# Patient Record
Sex: Female | Born: 1965 | Race: White | Hispanic: No | Marital: Married | Smoking: Current every day smoker
Health system: Southern US, Community
[De-identification: ages and names within clinical notes are randomized; demographics above are authoritative.]

## PROBLEM LIST (undated history)

## (undated) ENCOUNTER — Emergency Department: Discharge status: Absent without leave | Payer: Self-pay

## (undated) DIAGNOSIS — F419 Anxiety disorder, unspecified: Secondary | ICD-10-CM

## (undated) HISTORY — PX: TONSILLECTOMY: SUR1361

## (undated) HISTORY — PX: TUBAL LIGATION: SHX77

## (undated) HISTORY — PX: APPENDECTOMY: SHX54

---

## 2009-03-07 ENCOUNTER — Ambulatory Visit: Payer: Self-pay | Admitting: Family Medicine

## 2010-03-27 ENCOUNTER — Ambulatory Visit: Payer: Self-pay | Admitting: Family Medicine

## 2010-03-27 DIAGNOSIS — F411 Generalized anxiety disorder: Secondary | ICD-10-CM | POA: Insufficient documentation

## 2010-03-27 DIAGNOSIS — J069 Acute upper respiratory infection, unspecified: Secondary | ICD-10-CM | POA: Insufficient documentation

## 2010-03-27 DIAGNOSIS — F329 Major depressive disorder, single episode, unspecified: Secondary | ICD-10-CM

## 2010-03-27 DIAGNOSIS — F339 Major depressive disorder, recurrent, unspecified: Secondary | ICD-10-CM | POA: Insufficient documentation

## 2010-05-16 NOTE — Assessment & Plan Note (Signed)
Summary: Cough-dry, chest congestion, R ear pain x 1 dy rm 5   Vital Signs:  Patient Profile:   45 Years Old Female CC:      Cold & URI symptoms Height:     61 inches Weight:      134 pounds O2 Sat:      99 % O2 treatment:    Room Air Temp:     97.8 degrees F oral Pulse rate:   75 / minute Pulse rhythm:   regular Resp:     16 per minute BP sitting:   133 / 74  (left arm) Cuff size:   regular  Vitals Entered By: Areta Haber CMA (March 27, 2010 6:17 PM)                  Current Allergies: No known allergies History of Present Illness Chief Complaint: Cold & URI symptoms History of Present Illness:  Subjective: Patient complains of URI symptoms that started yesterday.  She continues to smoke + sore throat + cough No pleuritic pain ? wheezing + nasal congestion + post-nasal drainage ? sinus pain/pressure No itchy/red eyes No earache but ears feel clogged No hemoptysis No SOB No fever/chills No nausea No vomiting No abdominal pain No diarrhea No skin rashes + fatigue No myalgias No headache Used OTC meds without relief   Current Problems: UPPER RESPIRATORY INFECTION, ACUTE, WITH BRONCHITIS (ICD-465.9) DEPRESSION (ICD-311) ANXIETY (ICD-300.00)   Current Meds PROZAC 40 MG CAPS (FLUOXETINE HCL) 1 tab by mouth once daily WELLBUTRIN XL 150 MG XR24H-TAB (BUPROPION HCL) 1 tab by mouth once daily KLONOPIN 0.5 MG TABS (CLONAZEPAM) 1 tab by mouth three times a day DAYQUIL MULTI-SYMPTOM 30-325-10 MG/15ML LIQD (PSEUDOEPHEDRINE-APAP-DM) as directed AZITHROMYCIN 250 MG TABS (AZITHROMYCIN) Two tabs by mouth on day 1, then 1 tab daily on days 2 through 5 TUSSIONEX PENNKINETIC ER 8-10 MG/5ML LQCR (CHLORPHENIRAMINE-HYDROCODONE) 5 cc by mouth hs as needed cough  REVIEW OF SYSTEMS Constitutional Symptoms       Complains of fever.     Denies chills, night sweats, weight loss, weight gain, and fatigue.  Eyes       Complains of eye pain.      Denies change in  vision, eye discharge, glasses, contact lenses, and eye surgery.      Comments: Pressure Ear/Nose/Throat/Mouth       Complains of ear pain, sore throat, and hoarseness.      Denies hearing loss/aids, change in hearing, ear discharge, dizziness, frequent runny nose, frequent nose bleeds, sinus problems, and tooth pain or bleeding.      Comments: R  x 1dy Respiratory       Denies dry cough, productive cough, wheezing, shortness of breath, asthma, bronchitis, and emphysema/COPD.  Cardiovascular       Denies murmurs, chest pain, and tires easily with exhertion.      Comments: chest congestion   Gastrointestinal       Denies stomach pain, nausea/vomiting, diarrhea, constipation, blood in bowel movements, and indigestion. Genitourniary       Denies painful urination, kidney stones, and loss of urinary control. Neurological       Complains of headaches.      Denies paralysis, seizures, and fainting/blackouts. Musculoskeletal       Denies muscle pain, joint pain, joint stiffness, decreased range of motion, redness, swelling, muscle weakness, and gout.  Skin       Denies bruising, unusual mles/lumps or sores, and hair/skin or nail changes.  Psych  Denies mood changes, temper/anger issues, anxiety/stress, speech problems, depression, and sleep problems.  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  Extraocular movement is intact.  Conjunctivae are not inflamed.  Ears:  Canals normal.  Tympanic membranes normal.   Nose:  Normal septum.  Normal turbinates, mildly congested.    No sinus tenderness present.    Pharynx:  Normal  Neck:  Supple.  Slightly tender shotty anterior/posterior nodes are palpated bilaterally.  Lungs:   Bibasilar wheezes posteriorly.   Breath sounds are equal.  Heart:  Regular rate and rhythm without murmurs, rubs, or gallops.  Abdomen:  Nontender without masses or hepatosplenomegaly.  Bowel sounds are present.  No CVA or flank tenderness.  Extremities:  No edema.   Skin:  No rash Assessment New Problems: UPPER RESPIRATORY INFECTION, ACUTE, WITH BRONCHITIS (ICD-465.9) DEPRESSION (ICD-311) ANXIETY (ICD-300.00)  VIRAL URI WITH ? EARLY BRONCHITIS  Plan New Medications/Changes: Sandria Senter ER 8-10 MG/5ML LQCR (CHLORPHENIRAMINE-HYDROCODONE) 5 cc by mouth hs as needed cough  #2oz x 0, 03/27/2010, Donna Christen MD AZITHROMYCIN 250 MG TABS (AZITHROMYCIN) Two tabs by mouth on day 1, then 1 tab daily on days 2 through 5  #6 tabs x 0, 03/27/2010, Donna Christen MD  New Orders: Est. Patient Level III [16109] Pulse Oximetry (single measurment) [60454] Planning Comments:   Begin Z-pack, expectorant/decongestant, cough suppressant at bedtime.  Increase fluid intake Followup with PCP if not improving 10 to 12 days.   The patient and/or caregiver has been counseled thoroughly with regard to medications prescribed including dosage, schedule, interactions, rationale for use, and possible side effects and they verbalize understanding.  Diagnoses and expected course of recovery discussed and will return if not improved as expected or if the condition worsens. Patient and/or caregiver verbalized understanding.  Prescriptions: Sandria Senter ER 8-10 MG/5ML LQCR (CHLORPHENIRAMINE-HYDROCODONE) 5 cc by mouth hs as needed cough  #2oz x 0   Entered and Authorized by:   Donna Christen MD   Signed by:   Donna Christen MD on 03/27/2010   Method used:   Print then Give to Patient   RxID:   [id number] AZITHROMYCIN 250 MG TABS (AZITHROMYCIN) Two tabs by mouth on day 1, then 1  tab daily on days 2 through 5  #6 tabs x 0   Entered and Authorized by:   Donna Christen MD   Signed by:   Donna Christen MD on 03/27/2010   Method used:   Print then Give to Patient   RxID:   [id number]   Patient Instructions: 1)  May use Mucinex D (guaifenesin with decongestant) twice daily for congestion. 2)  Increase fluid intake, rest. 3)  May use Afrin nasal spray (or generic oxymetazoline) twice daily for about 5 days.  Also recommend using saline nasal spray several times daily and/or saline nasal irrigation. 4)  Followup with family doctor if not improving 10 to 12 days.  Orders Added: 1)  Est. Patient Level III [41324] 2)  Pulse Oximetry (single measurment) [40102]

## 2010-05-16 NOTE — Letter (Signed)
Summary: Out of Work  [facility]  [address]   [place], [place] [address]   Phone: [phone]  Fax: [phone]    March 27, 2010   Employee:  Gita Kudo    To Whom It May Concern:   For Medical reasons, please excuse the above named employee from work today.    If you need additional information, please feel free to contact our office.         Sincerely,    Donna Christen MD

## 2010-11-13 ENCOUNTER — Inpatient Hospital Stay (INDEPENDENT_AMBULATORY_CARE_PROVIDER_SITE_OTHER)
Admission: RE | Admit: 2010-11-13 | Discharge: 2010-11-13 | Disposition: A | Discharge status: Home / Self Care | Payer: BC Managed Care – PPO | Source: Ambulatory Visit | Attending: Emergency Medicine | Admitting: Emergency Medicine

## 2010-11-13 ENCOUNTER — Encounter (INDEPENDENT_AMBULATORY_CARE_PROVIDER_SITE_OTHER): Payer: Self-pay | Admitting: *Deleted

## 2010-11-13 ENCOUNTER — Encounter: Payer: Self-pay | Admitting: Emergency Medicine

## 2010-11-13 DIAGNOSIS — J069 Acute upper respiratory infection, unspecified: Secondary | ICD-10-CM

## 2010-11-13 DIAGNOSIS — J209 Acute bronchitis, unspecified: Secondary | ICD-10-CM

## 2010-11-13 DIAGNOSIS — R05 Cough: Secondary | ICD-10-CM

## 2011-01-19 ENCOUNTER — Encounter: Payer: Self-pay | Admitting: Family Medicine

## 2011-01-19 ENCOUNTER — Inpatient Hospital Stay (INDEPENDENT_AMBULATORY_CARE_PROVIDER_SITE_OTHER)
Admission: RE | Admit: 2011-01-19 | Discharge: 2011-01-19 | Disposition: A | Discharge status: Home / Self Care | Payer: BC Managed Care – PPO | Source: Ambulatory Visit | Attending: Family Medicine | Admitting: Family Medicine

## 2011-01-19 DIAGNOSIS — J209 Acute bronchitis, unspecified: Secondary | ICD-10-CM | POA: Insufficient documentation

## 2011-03-17 NOTE — Letter (Signed)
Summary: Out of Work  [facility]  [address]   [place], [place] [address]   Phone: [phone]  Fax: [phone]    November 13, 2010   Employee:  Laurie Berry    To Whom It May Concern:   For Medical reasons, please excuse the above named employee from work for the following dates:  Start:   11/13/10  End:   11/15/10  If you need additional information, please feel free to contact our office.         Sincerely,    Clemens Catholic LPN

## 2011-03-17 NOTE — Progress Notes (Signed)
Summary: COUGH   Vital Signs:  Patient Profile:   45 Years Old Female CC:      Cough Height:     61 inches Weight:      130.25 pounds O2 Sat:      98 % O2 treatment:    Room Air Temp:     97.9 degrees F oral Pulse rate:   96 / minute Resp:     18 per minute BP sitting:   130 / 80  (left arm) Cuff size:   regular  Vitals Entered By: Clemens Catholic LPN (November 13, 2010 5:30 PM)                  Updated Prior Medication List: PROZAC 40 MG CAPS (FLUOXETINE HCL) 1 tab by mouth once daily WELLBUTRIN XL 150 MG XR24H-TAB (BUPROPION HCL) 1 tab by mouth once daily ALPRAZOLAM 0.5 MG TABS (ALPRAZOLAM)   Current Allergies (reviewed today): No known allergies History of Present Illness History from: patient Chief Complaint: Cough History of Present Illness: 45 Years Old Female complains of onset of cold symptoms for 7-10 days.  Money has been using no OTC meds.  She is a smoker.   No sore throat + cough No pleuritic pain No wheezing + nasal congestion + post-nasal drainage No sinus pain/pressure + chest congestion No itchy/red eyes No earache No hemoptysis No SOB No chills/sweats No fever No nausea No vomiting No abdominal pain No diarrhea No skin rashes No fatigue No myalgias No headache   REVIEW OF SYSTEMS Constitutional Symptoms       Complains of fatigue.     Denies fever, chills, night sweats, weight loss, and weight gain.  Eyes       Complains of eye pain and glasses.      Denies change in vision, eye discharge, contact lenses, and eye surgery. Ear/Nose/Throat/Mouth       Complains of sore throat.      Denies hearing loss/aids, change in hearing, ear pain, ear discharge, dizziness, frequent runny nose, frequent nose bleeds, sinus problems, hoarseness, and tooth pain or bleeding.  Respiratory       Complains of productive cough.      Denies dry cough, wheezing, shortness of breath, asthma, bronchitis, and emphysema/COPD.  Cardiovascular       Denies  murmurs, chest pain, and tires easily with exhertion.    Gastrointestinal       Denies stomach pain, nausea/vomiting, diarrhea, constipation, blood in bowel movements, and indigestion. Genitourniary       Denies painful urination, kidney stones, and loss of urinary control. Neurological       Denies paralysis, seizures, and fainting/blackouts. Musculoskeletal       Denies muscle pain, joint pain, joint stiffness, decreased range of motion, redness, swelling, muscle weakness, and gout.  Skin       Denies bruising, unusual mles/lumps or sores, and hair/skin or nail changes.  Psych       Denies mood changes, temper/anger issues, anxiety/stress, speech problems, depression, and sleep problems. Other Comments: pt c/o productive cough x 1 1/2 wk. she has a hx of bronchitis. no fever.    Past History:  Past Medical History: Reviewed history from 03/27/2010 and no changes required.   Anxiety Depression Bipolar  Past Surgical History: Reviewed history from 03/07/2009 and no changes required. Denies surgical history  Family History: Reviewed history from 03/07/2009 and no changes required. Mother, Healthy Father, Healthy  Social History: Reviewed history from 03/07/2009 and no  changes required. 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 Exam General appearance: well developed, well nourished, no acute distress Ears: normal, no lesions or deformities Nasal: mucosa pink, nonedematous, no septal deviation, turbinates normal Oral/Pharynx: tongue normal, posterior pharynx without erythema or exudate Chest/Lungs: no rales, wheezes, or rhonchi bilateral, breath sounds equal without effort Heart: regular rate and  rhythm, no murmur MSE: oriented to time, place, and person Assessment New Problems: BRONCHITIS, ACUTE (ICD-466.0) COUGH (ICD-786.2) UPPER RESPIRATORY INFECTION, ACUTE (ICD-465.9)  She is feeling a little better after the nebulizer, and a little  jittery  Plan New Medications/Changes: HYDROCODONE-HOMATROPINE 5-1.5 MG/5ML SYRP (HYDROCODONE-HOMATROPINE) 5cc q8 hrs as needed for cough  #3oz x 0, 11/13/2010, Hoyt Koch MD ZITHROMAX Z-PAK 250 MG TABS (AZITHROMYCIN) use as directed  #1 x 0, 11/13/2010, Hoyt Koch MD  New Orders: Est. Patient Level IV [40981] Pulse Oximetry (single measurment) [94760] Nebulizer Tx [94640] Albuterol Sulfate Sol 1mg  unit dose [X9147] Ipratropium inhalation sol. unit dose [W2956] Planning Comments:   1)  Take the prescribed antibiotic as instructed.  Neb treatment given in clinic.  If not improving, consider Prenisone.  If F/C, consider CXR or CBC. 2)  Use nasal saline solution (over the counter) at least 3 times a day. 3)  Use over the counter decongestants like Zyrtec-D every 12 hours as needed to help with congestion. 4)  Can take tylenol every 6 hours or motrin every 8 hours for pain or fever. 5)  Follow up with your primary doctor  if no improvement in 5-7 days, sooner if increasing pain, fever, or new symptoms.    The patient and/or caregiver has been counseled thoroughly with regard to medications prescribed including dosage, schedule, interactions, rationale for use, and possible side effects and they verbalize understanding.  Diagnoses and expected course of recovery discussed and will return if not improved as expected or if the condition worsens. Patient and/or caregiver verbalized understanding.  Prescriptions: HYDROCODONE-HOMATROPINE 5-1.5 MG/5ML SYRP (HYDROCODONE-HOMATROPINE) 5cc q8 hrs as needed for cough  #3oz x 0   Entered and Authorized by:   Hoyt Koch MD   Signed by:   Hoyt Koch MD on 11/13/2010   Method used:   Print then Give to Patient   RxID:   [id number] ZITHROMAX Z-PAK 250 MG TABS (AZITHROMYCIN) use as directed  #1 x 0   Entered and Authorized by:   Hoyt Koch MD   Signed by:   Hoyt Koch MD on 11/13/2010   Method used:   Print  then Give to Patient   RxID:   [id number]   Medication Administration  Injection # 1:    Medication:    Medication # 1:    Medication: Albuterol Sulfate Sol 1mg  unit dose    Diagnosis: BRONCHITIS, ACUTE (ICD-466.0)    Dose: 3.0mg     Route: inhaled    Exp Date: 08/13/2011    Lot #: [id number]    Mfr: mylan    Patient tolerated medication without complications    Given by: Clemens Catholic LPN (November 13, 2010 5:45 PM)  Medication # 2:    Medication: Ipratropium inhalation sol. unit dose    Diagnosis: BRONCHITIS, ACUTE (ICD-466.0)    Dose: 0.5mg      Route: inhaled    Exp Date: 08/13/2011    Lot #: [id number]    Mfr: mylan    Patient tolerated medication without complications    Given by: Clemens Catholic LPN (November 13, 2010 5:45 PM)  Orders Added:  1)  Est. Patient Level IV [11914] 2)  Pulse Oximetry (single measurment) [94760] 3)  Nebulizer Tx [94640] 4)  Albuterol Sulfate Sol 1mg  unit dose [J7613] 5)  Ipratropium inhalation sol. unit dose [N8295]

## 2011-03-17 NOTE — Progress Notes (Signed)
Summary: cough/ear pain/TM(rm4()   Vital Signs:  Patient Profile:   45 Years Old Female CC:      cough/ ear pain Height:     59 inches Weight:      118.50 pounds O2 treatment:    Room Air Temp:     97.7 degrees F oral Pulse rate:   73 / minute Resp:     20 per minute BP sitting:   151 / 79  (left arm) Cuff size:   regular  Vitals Entered By: Laurie Flemings RN (January 19, 2011 12:20 PM)                  Updated Prior Medication List: PROZAC 40 MG CAPS (FLUOXETINE HCL) 1 tab by mouth once daily ALPRAZOLAM 0.5 MG TABS (ALPRAZOLAM)   Current Allergies (reviewed today): No known allergies History of Present Illness Chief Complaint: cough/ ear pain History of Present Illness: 45 yo F here for > 1 week of cough.  Patient reports some production with this as well.  Lots of sick contacts at work with similar symptoms.  Has since developed right ear pain and pressure as well.  Some wheezing.  No fever, chills, night sweats.  She is a smoker.  Recently seen 2 months ago for URI symptoms - given azithromycin and cough medicine and improved.  REVIEW OF SYSTEMS Constitutional Symptoms      Denies fever, chills, night sweats, weight loss, weight gain, and fatigue.  Eyes       Denies change in vision, eye pain, eye discharge, glasses, contact lenses, and eye surgery. Ear/Nose/Throat/Mouth       Complains of ear pain.      Denies hearing loss/aids, change in hearing, ear discharge, dizziness, frequent runny nose, frequent nose bleeds, sinus problems, sore throat, hoarseness, and tooth pain or bleeding.  Respiratory       Complains of dry cough and wheezing.      Denies productive cough, shortness of breath, asthma, bronchitis, and emphysema/COPD.  Cardiovascular       Denies murmurs, chest pain, and tires easily with exhertion.    Gastrointestinal       Denies stomach pain, nausea/vomiting, diarrhea, constipation, blood in bowel movements, and indigestion. Genitourniary       Denies  painful urination, kidney stones, and loss of urinary control. Neurological       Denies paralysis, seizures, and fainting/blackouts. Musculoskeletal       Denies muscle pain, joint pain, joint stiffness, decreased range of motion, redness, swelling, muscle weakness, and gout.  Skin       Denies bruising, unusual mles/lumps or sores, and hair/skin or nail changes.  Psych       Denies mood changes, temper/anger issues, anxiety/stress, speech problems, depression, and sleep problems. Other Comments: states right ear pain started this week and cough x 1 week w/out relief from OTC meds.   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   Anxiety Depression Bipolar  Past Surgical History: Reviewed history from 03/07/2009 and no changes required. Denies surgical history  Family History: Reviewed history from 03/07/2009 and no changes required. Mother, Healthy Father, Healthy  Social History: Reviewed history from 11/13/2010 and no changes required. 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 Patient Level III [47829] Planning Comments:   Patient likely with viral URI though > 7 days may benefit with abx - try bactrim ds  two times a day x 10 days as she was on azithro about 2 months ago.  Hydrocodone/homatropine 5-1.5/25ml - 5mL by mouth two times a day as needed cough - did well with this cough medicine previously.  OTC medicine instruction handout provided.   The patient and/or caregiver has been counseled thoroughly with regard to medications prescribed including dosage, schedule, interactions, rationale for use, and possible side effects and they verbalize understanding.  Diagnoses and expected course of recovery discussed and will return if not improved as expected or if the condition worsens. Patient and/or caregiver verbalized understanding.   Orders Added: 1)  Est. Patient Level III [56213]

## 2011-04-20 ENCOUNTER — Encounter: Payer: Self-pay | Admitting: Emergency Medicine

## 2011-04-20 ENCOUNTER — Emergency Department
Admission: EM | Admit: 2011-04-20 | Discharge: 2011-04-20 | Disposition: A | Discharge status: Home / Self Care | Payer: BC Managed Care – PPO | Source: Home / Self Care | Attending: Family Medicine | Admitting: Family Medicine

## 2011-04-20 DIAGNOSIS — J069 Acute upper respiratory infection, unspecified: Secondary | ICD-10-CM

## 2011-04-20 DIAGNOSIS — J0100 Acute maxillary sinusitis, unspecified: Secondary | ICD-10-CM

## 2011-04-20 HISTORY — DX: Anxiety disorder, unspecified: F41.9

## 2011-04-20 MED ORDER — BENZONATATE 200 MG PO CAPS: 200.0000 mg | ORAL_CAPSULE | Freq: Every day | ORAL | Status: AC

## 2011-04-20 MED ORDER — SULFAMETHOXAZOLE-TRIMETHOPRIM 800-160 MG PO TABS: 1.0000 | ORAL_TABLET | Freq: Two times a day (BID) | ORAL | Status: AC

## 2011-04-20 NOTE — ED Notes (Signed)
Cough, congestion, fever (at home) with ER eval on past Wed; no Flu test done and MD said viral URI and no Rxs. Pt did not have Flu vaccination this season.

## 2011-04-20 NOTE — ED Provider Notes (Signed)
History     CSN: [id number]  Arrival date & time 04/20/11  1102   First MD Initiated Contact with Patient 04/20/11 1126      Chief Complaint  Patient presents with  . Cough      HPI Comments: HPI : Flu symptoms started 4 days ago. Fever with chills, sweats, myalgias, fatigue, headache, sore throat, nasal congestion and cough.  Cough is progressively worsening, despite trying Robitussin DM.  She remains fatigued.  She visited ER on day of illness onset and was diagnosed with viral URI.  Her nasal congestion and facial discomfort have increased.  She tried a left over Septra DS tablet, and had mild brief improvement in her sinus symptoms.  Review of Systems: Positive for fatigue, nasal congestion, sore throat, mild swollen anterior neck glands, cough. Negative for acute vision changes, stiff neck, focal weakness, syncope, seizures, respiratory distress, vomiting, diarrhea, GU symptoms.   Patient is a 46 y.o. female presenting with cough. The history is provided by the patient.  Cough This is a new problem. Episode onset: 4 days ago. The problem occurs constantly. The problem has been gradually worsening. The cough is non-productive. There has been no fever. Associated symptoms include chills, sweats, ear congestion, ear pain, headaches, rhinorrhea, sore throat and myalgias. Pertinent negatives include no chest pain, no shortness of breath, no wheezing and no eye redness. She has tried cough syrup for the symptoms. The treatment provided no relief. She is a smoker.    Past Medical History  Diagnosis Date  . Anxiety     Past Surgical History  Procedure Date  . Tonsillectomy   . Appendectomy   . Tubal ligation     No family history on file.  History  Substance Use Topics  . Smoking status: Current Everyday Smoker  . Smokeless tobacco: Not on file  . Alcohol Use:     OB History    Grav Para Term Preterm Abortions TAB SAB Ect Mult Living                  Review of Systems   Constitutional: Positive for chills.  HENT: Positive for ear pain, sore throat and rhinorrhea.   Eyes: Negative for redness.  Respiratory: Positive for cough. Negative for shortness of breath and wheezing.   Cardiovascular: Negative for chest pain.  Musculoskeletal: Positive for myalgias.  Neurological: Positive for headaches.   + sore throat, primarily when coughing + cough, non productive, worse at night. No pleuritic pain No wheezing + nasal congestion + post-nasal drainage + sinus pain/pressure No itchy/red eyes ? right earache No hemoptysis No SOB No fever/chills presently No nausea No vomiting No abdominal pain No diarrhea No urinary symptoms No skin rashes + fatigue No myalgias presently + headache Used OTC meds without relief  Allergies  Review of patient's allergies indicates no known allergies.  Home Medications   Current Outpatient Rx  Name Route Sig Dispense Refill  . RANITIDINE HCL 75 MG/5ML PO SYRP Oral Take 0.5 mg by mouth 3 (three) times daily.      Marland Kitchen BENZONATATE 200 MG PO CAPS Oral Take 1 capsule (200 mg total) by mouth at bedtime. Take as needed for cough 12 capsule 0  . SULFAMETHOXAZOLE-TRIMETHOPRIM 800-160 MG PO TABS Oral Take 1 tablet by mouth 2 (two) times daily. 20 tablet 0    BP 147/80  Temp(Src) 98.2 F (36.8 C) (Oral)  Resp 16  SpO2 97%  Physical Exam Nursing notes and Vital Signs reviewed. Appearance:  Patient appears healthy, stated age, and in no acute distress Eyes:  Pupils are equal, round, and reactive to light and accomodation.  Extraocular movement is intact.  Conjunctivae are not inflamed  Ears:  Canals normal.  Tympanic membranes normal.  Nose:  Congested turbinates.  Maxillary sinus tenderness is present.  Pharynx:  Normal Neck:  Supple.  No adenopathy Lungs:   Faint scattered rhonchi; no rales.  Breath sounds are equal.  Heart:  Regular rate and rhythm without murmurs, rubs, or gallops.  Abdomen:  Nontender without  masses or hepatosplenomegaly.  Bowel sounds are present.  No CVA or flank tenderness.  Extremities:  No edema.  No calf tenderness Skin:  No rash present.   ED Course  Procedures  none      1. Acute upper respiratory infections of unspecified site   2. Acute maxillary sinusitis       MDM  Begin Septra DS for 10 days; Tessalon at bedtime Take Mucinex D (guaifenesin with decongestant) twice daily for congestion.  Increase fluid intake, rest. May use Afrin nasal spray (or generic oxymetazoline) twice daily for about 5 days.  Also recommend using saline nasal spray several times daily and/or saline nasal irrigation. Stop all antihistamines for now, and other non-prescription cough/cold preparations. Follow-up with family doctor if not improving 7 to 10 days.         Donna Christen, MD 04/20/11 1202

## 2011-05-04 ENCOUNTER — Encounter: Payer: Self-pay | Admitting: Emergency Medicine

## 2011-05-04 ENCOUNTER — Emergency Department
Admission: EM | Admit: 2011-05-04 | Discharge: 2011-05-04 | Disposition: A | Discharge status: Home / Self Care | Payer: BC Managed Care – PPO | Source: Home / Self Care | Attending: Emergency Medicine | Admitting: Emergency Medicine

## 2011-05-04 DIAGNOSIS — R05 Cough: Secondary | ICD-10-CM

## 2011-05-04 DIAGNOSIS — J069 Acute upper respiratory infection, unspecified: Secondary | ICD-10-CM

## 2011-05-04 MED ORDER — PROMETHAZINE-CODEINE 6.25-10 MG/5ML PO SYRP: 5.0000 mL | ORAL_SOLUTION | ORAL | Status: AC | PRN

## 2011-05-04 NOTE — ED Provider Notes (Addendum)
History     CSN: [id number]  Arrival date & time 05/04/11  1640   First MD Initiated Contact with Patient 05/04/11 1649      Chief Complaint  Patient presents with  . Cough    (Consider location/radiation/quality/duration/timing/severity/associated sxs/prior treatment) HPI Laurie Berry is a 46 y.o. female who complains of onset of cold symptoms for 2 weeks.  About 2 weeks ago she thinks she had the flu or other upper or infection and was given Bactrim as well as Tessalon. She is feeling better in terms of the infection but is still coughing a lot and the Tessalon as not helping.  No sore throat + cough No pleuritic pain No wheezing + nasal congestion No post-nasal drainage No sinus pain/pressure No chest congestion No itchy/red eyes No earache No hemoptysis No SOB No chills/sweats No fever No nausea No vomiting No abdominal pain No diarrhea No skin rashes No fatigue No myalgias No headache    Past Medical History  Diagnosis Date  . Anxiety     Past Surgical History  Procedure Date  . Tonsillectomy   . Appendectomy   . Tubal ligation     History reviewed. No pertinent family history.  History  Substance Use Topics  . Smoking status: Current Everyday Smoker  . Smokeless tobacco: Not on file  . Alcohol Use:     OB History    Grav Para Term Preterm Abortions TAB SAB Ect Mult Living                  Review of Systems  Allergies  Review of patient's allergies indicates no known allergies.  Home Medications   Current Outpatient Rx  Name Route Sig Dispense Refill  . ALPRAZOLAM 0.5 MG PO TABS Oral Take 0.5 mg by mouth 3 (three) times daily.      Marland Kitchen RANITIDINE HCL 75 MG/5ML PO SYRP Oral Take 0.5 mg by mouth 3 (three) times daily.        BP 115/71  Pulse 82  Temp(Src) 98.4 F (36.9 C) (Oral)  Resp 18  Ht 5' (1.524 m)  Wt 110 lb (49.896 kg)  BMI 21.48 kg/m2  SpO2 100%  Physical Exam  Nursing note and vitals reviewed. Constitutional: She is  oriented to person, place, and time. She appears well-developed and well-nourished.  HENT:  Head: Normocephalic and atraumatic.  Right Ear: Tympanic membrane, external ear and ear canal normal.  Left Ear: Tympanic membrane, external ear and ear canal normal.  Nose: Mucosal edema and rhinorrhea present.  Mouth/Throat: Posterior oropharyngeal erythema present. No oropharyngeal exudate or posterior oropharyngeal edema.  Eyes: No scleral icterus.  Neck: Neck supple.  Cardiovascular: Regular rhythm and normal heart sounds.   Pulmonary/Chest: Effort normal and breath sounds normal. No respiratory distress.  Neurological: She is alert and oriented to person, place, and time.  Skin: Skin is warm and dry.  Psychiatric: She has a normal mood and affect. Her speech is normal.    ED Course  Procedures (including critical care time)  Labs Reviewed - No data to display No results found.   No diagnosis found.    MDM  1)  no antibiotic prescribed today since his is likely viral. If she is not getting to better despite the cough medicine, then the next effort likely be prednisone. 2)  Use nasal saline solution (over the counter) at least 3 times a day. 3)  Use over the counter decongestants like Zyrtec-D every 12 hours as needed to help with  congestion.  If you have hypertension, do not take medicines with sudafed.  4)  Can take tylenol every 6 hours or motrin every 8 hours for pain or fever. 5)  Follow up with your primary doctor if no improvement in 5-7 days, sooner if increasing pain, fever, or new symptoms.    She also spoke to me about some anxiety that she's been having I advised her to follow up with her primary care physician about this I also discussed with her some stress reducing techniques.   Lily Kocher, MD 05/04/11 1709  Lily Kocher, MD 05/04/11 [phone]

## 2011-05-04 NOTE — ED Notes (Signed)
Cough and fatigue from lack of sleep; was seen here 2 weeks ago for URI.

## 2011-05-05 ENCOUNTER — Telehealth: Payer: Self-pay | Admitting: *Deleted

## 2011-06-29 ENCOUNTER — Emergency Department
Admission: EM | Admit: 2011-06-29 | Discharge: 2011-06-29 | Disposition: A | Discharge status: Home / Self Care | Payer: Self-pay | Source: Home / Self Care | Attending: Family Medicine | Admitting: Family Medicine

## 2011-06-29 ENCOUNTER — Encounter: Payer: Self-pay | Admitting: Emergency Medicine

## 2011-06-29 DIAGNOSIS — J069 Acute upper respiratory infection, unspecified: Secondary | ICD-10-CM

## 2011-06-29 MED ORDER — BENZONATATE 200 MG PO CAPS: 200.0000 mg | ORAL_CAPSULE | Freq: Every day | ORAL | Status: AC

## 2011-06-29 MED ORDER — PREDNISONE 10 MG PO TABS: ORAL_TABLET | ORAL | Status: DC

## 2011-06-29 MED ORDER — CLARITHROMYCIN 500 MG PO TABS: 500.0000 mg | ORAL_TABLET | Freq: Two times a day (BID) | ORAL | Status: AC

## 2011-06-29 NOTE — ED Notes (Signed)
URI was actually in January; finally responded to course of Prednisone and pt. wondering if she could have that tx again.

## 2011-06-29 NOTE — ED Notes (Signed)
Cough and congestion x 2 days; had similar S/S last month. Previous smoker.

## 2011-06-29 NOTE — Discharge Instructions (Signed)
Take plain Mucinex, or Mucinex D (guaifenesin with decongestant) twice daily for cough and congestion.  Increase fluid intake, rest. May use Afrin nasal spray (or generic oxymetazoline) twice daily for about 5 days.  Also recommend using saline nasal spray several times daily and saline nasal irrigation (AYR is a common brand) Stop all antihistamines for now, and other non-prescription cough/cold preparations. Begin Biaxin if not improving about 5 days or if persistent fever develops. Follow-up with family doctor if not improving 7 to 10 days.

## 2011-06-29 NOTE — ED Provider Notes (Signed)
History     CSN: [id number]  Arrival date & time 06/29/11  1600   First MD Initiated Contact with Patient 06/29/11 1626      Chief Complaint  Patient presents with  . Cough      HPI Comments: Patient complains of 2 day history of gradually progressive URI symptoms beginning with chills, a mild sore throat (now improved), nasal congestion and a mild cough.  Complains of fatigue but no myalgias.  Cough is now worse at night and generally non-productive during the day.  There has been no pleuritic pain or shortness of breath, but she does wheeze.  The history is provided by the patient.    Past Medical History  Diagnosis Date  . Anxiety     Past Surgical History  Procedure Date  . Tonsillectomy   . Appendectomy   . Tubal ligation     History reviewed. No pertinent family history.  History  Substance Use Topics  . Smoking status: Former Games developer  . Smokeless tobacco: Not on file  . Alcohol Use: No    OB History    Grav Para Term Preterm Abortions TAB SAB Ect Mult Living                  Review of Systems + sore throat + cough No pleuritic pain + wheezing + nasal congestion + post-nasal drainage No sinus pain/pressure No itchy/red eyes No earache No hemoptysis No SOB No fever, + chills No nausea No vomiting No abdominal pain No diarrhea No urinary symptoms No skin rashes + fatigue No myalgias No headache   Allergies  Review of patient's allergies indicates no known allergies.  Home Medications   Current Outpatient Rx  Name Route Sig Dispense Refill  . ALPRAZOLAM 0.5 MG PO TABS Oral Take 0.5 mg by mouth 3 (three) times daily.      Marland Kitchen BENZONATATE 200 MG PO CAPS Oral Take 1 capsule (200 mg total) by mouth at bedtime. Take as needed for cough 12 capsule 0  . CLARITHROMYCIN 500 MG PO TABS Oral Take 1 tablet (500 mg total) by mouth 2 (two) times daily. Take for one week.  (Rx void after 07/07/11) 14 tablet 0  . PREDNISONE 10 MG PO TABS  Take 2 tabs by  mouth today, then two tabs twice daily for two days, then one tab twice daily for 2 days, then 1 tab daily for two days.  Take PC 16 tablet 0  . RANITIDINE HCL 75 MG/5ML PO SYRP Oral Take 0.5 mg by mouth 3 (three) times daily.        BP 116/75  Pulse 83  Temp(Src) 97.9 F (36.6 C) (Oral)  Resp 18  Ht 5' (1.524 m)  Wt 112 lb (50.803 kg)  BMI 21.87 kg/m2  SpO2 100%  Physical Exam Nursing notes and Vital Signs reviewed. Appearance:  Patient appears healthy, stated age, and in no acute distress Eyes:  Pupils are equal, round, and reactive to light and accomodation.  Extraocular movement is intact.  Conjunctivae are not inflamed  Ears:  Canals normal.  Tympanic membranes normal.  Nose:  Mildly congested turbinates.  No sinus tenderness.   Maxillary sinus tenderness is present.  Pharynx:  Normal Neck:  Supple.  Slightly tender shotty posterior nodes are palpated bilaterally  Lungs:  Clear to auscultation.  Breath sounds are equal.  Heart:  Regular rate and rhythm without murmurs, rubs, or gallops.  Abdomen:  Nontender without masses or hepatosplenomegaly.  Bowel sounds  are present.  No CVA or flank tenderness.  Extremities:  No edema.  No calf tenderness Skin:  No rash present.   ED Course  Procedures  none      1. Acute upper respiratory infections of unspecified site       MDM   There is no evidence of bacterial infection today.   Treat symptomatically for now:  Start tapering course of prednisone, and Tessalon at bedtime. Take plain Mucinex, or Mucinex D (guaifenesin with decongestant) twice daily for cough and congestion.  Increase fluid intake, rest. May use Afrin nasal spray (or generic oxymetazoline) twice daily for about 5 days.  Also recommend using saline nasal spray several times daily and saline nasal irrigation (AYR is a common brand) Stop all antihistamines for now, and other non-prescription cough/cold preparations. Begin Biaxin if not improving about 5 days or  if persistent fever develops (Given a prescription to hold, with an expiration date)  Follow-up with family doctor if not improving 7 to 10 days.         Lattie Haw, MD 06/30/11 [phone]

## 2011-08-22 ENCOUNTER — Encounter: Payer: Self-pay | Admitting: *Deleted

## 2011-08-22 ENCOUNTER — Emergency Department
Admit: 2011-08-22 | Discharge: 2011-08-22 | Disposition: A | Discharge status: Home / Self Care | Payer: BC Managed Care – PPO

## 2011-08-22 ENCOUNTER — Emergency Department
Admission: EM | Admit: 2011-08-22 | Discharge: 2011-08-22 | Disposition: A | Discharge status: Home / Self Care | Payer: BC Managed Care – PPO | Source: Home / Self Care | Attending: Family Medicine | Admitting: Family Medicine

## 2011-08-22 DIAGNOSIS — J209 Acute bronchitis, unspecified: Secondary | ICD-10-CM

## 2011-08-22 DIAGNOSIS — J069 Acute upper respiratory infection, unspecified: Secondary | ICD-10-CM

## 2011-08-22 MED ORDER — GUAIFENESIN-CODEINE 100-10 MG/5ML PO SYRP: 10.0000 mL | ORAL_SOLUTION | Freq: Every day | ORAL | Status: AC

## 2011-08-22 MED ORDER — DOXYCYCLINE HYCLATE 100 MG PO TABS: 100.0000 mg | ORAL_TABLET | Freq: Two times a day (BID) | ORAL | Status: AC

## 2011-08-22 MED ORDER — PREDNISONE 10 MG PO TABS: ORAL_TABLET | ORAL | Status: DC

## 2011-08-22 NOTE — ED Provider Notes (Signed)
History     CSN: [id number]  Arrival date & time 08/22/11  1418   First MD Initiated Contact with Patient 08/22/11 1429      Chief Complaint  Patient presents with  . Cough     HPI Comments: Patient complains of approximately 7 day history of gradually progressive URI symptoms beginning with a mild sore throat (now improved), followed by progressive nasal congestion.  A cough started next.    Complains of fatigue but no myalgias.  Cough is now worse at night and generally non-productive during the day.  There has been no pleuritic pain, shortness of breath, or wheezes.  She states that she just recovered from a URI two months ago, and had the flu four months ago.  She quit smoking about 6 months ago.   She has had "walking pneumonia" twice in the past.  No history of asthma.  The history is provided by the patient.    Past Medical History  Diagnosis Date  . Anxiety     Past Surgical History  Procedure Date  . Tonsillectomy   . Appendectomy   . Tubal ligation     No family history on file.  History  Substance Use Topics  . Smoking status: Former Games developer  . Smokeless tobacco: Not on file  . Alcohol Use: No    OB History    Grav Para Term Preterm Abortions TAB SAB Ect Mult Living                  Review of Systems + sore throat + cough No pleuritic pain No wheezing + nasal congestion + post-nasal drainage No sinus pain/pressure No itchy/red eyes No earache No hemoptysis No SOB No fever/chills No nausea No vomiting No abdominal pain No diarrhea No urinary symptoms No skin rashes + fatigue No myalgias No headache Used OTC meds without relief  Allergies  Review of patient's allergies indicates no known allergies.  Home Medications   Current Outpatient Rx  Name Route Sig Dispense Refill  . ALPRAZOLAM 0.5 MG PO TABS Oral Take 0.5 mg by mouth 3 (three) times daily.      Marland Kitchen DOXYCYCLINE HYCLATE 100 MG PO TABS Oral Take 1 tablet (100 mg total) by mouth 2  (two) times daily. 20 tablet 0  . GUAIFENESIN-CODEINE 100-10 MG/5ML PO SYRP Oral Take 10 mLs by mouth at bedtime. for cough 120 mL 0  . PREDNISONE 10 MG PO TABS  Take 2 tabs by mouth today, then two tabs twice daily for two days, then one tab twice daily for 2 days, then 1 tab daily for two days.  Take PC 16 tablet 0  . RANITIDINE HCL 75 MG/5ML PO SYRP Oral Take 0.5 mg by mouth 3 (three) times daily.        BP 123/80  Pulse 65  Temp(Src) 98.1 F (36.7 C) (Oral)  Resp 16  Ht 5' (1.524 m)  Wt 111 lb (50.349 kg)  BMI 21.68 kg/m2  SpO2 99%  Physical Exam Nursing notes and Vital Signs reviewed. Appearance:  Patient appears healthy, stated age, and in no acute distress Eyes:  Pupils are equal, round, and reactive to light and accomodation.  Extraocular movement is intact.  Conjunctivae are not inflamed  Ears:  Canals normal.  Tympanic membranes normal.  Nose:  Mildly congested turbinates with clear mucous. No sinus tenderness.   Pharynx:  Normal Neck:  Supple.  Slightly tender shotty posterior nodes are palpated bilaterally  Lungs:  Clear to  auscultation.  Breath sounds are equal.  Heart:  Regular rate and rhythm without murmurs, rubs, or gallops.  Abdomen:  Nontender without masses or hepatosplenomegaly.  Bowel sounds are present.  No CVA or flank tenderness.  Extremities:  No edema.  No calf tenderness Skin:  No rash present.   ED Course  Procedures  none  Labs Reviewed - No data to display Dg Chest 2 View  08/22/2011  *RADIOLOGY REPORT*  Clinical Data: Recurrent cough, former smoking history  CHEST - 2 VIEW  Comparison: None.  Findings: The lungs are clear.  There is mild peribronchial thickening present.  Mediastinal contours appear normal.  The heart is within normal limits in size.  No bony abnormality is seen.  IMPRESSION: No active lung disease.  Mild peribronchial thickening.  Original Report Authenticated By: Juline Patch, M.D.     1. Acute upper respiratory infections of  unspecified site   2. Acute bronchitis       MDM   Begin doxycycline and tapering course of prednisone.  Rx for guaifenesin/codeine at bedtime. Take Mucinex D (guaifenesin with decongestant) twice daily for congestion.  Increase fluid intake, rest. May use Afrin nasal spray (or generic oxymetazoline) twice daily for about 5 days.  Also recommend using saline nasal spray several times daily and saline nasal irrigation (AYR is a common brand) Stop all antihistamines for now, and other non-prescription cough/cold preparations. Follow-up with family doctor if not improving 7 to 10 days.         Lattie Haw, MD 08/22/11 [phone]

## 2011-08-22 NOTE — ED Notes (Signed)
Patient c/o persistent dry cough x 3 days. Denies fever. Taking halls cough drops.

## 2011-08-22 NOTE — Discharge Instructions (Signed)
Take Mucinex D (guaifenesin with decongestant) twice daily for congestion.  Increase fluid intake, rest. May use Afrin nasal spray (or generic oxymetazoline) twice daily for about 5 days.  Also recommend using saline nasal spray several times daily and saline nasal irrigation (AYR is a common brand) Stop all antihistamines for now, and other non-prescription cough/cold preparations. Follow-up with family doctor if not improving 7 to 10 days.  

## 2011-08-24 ENCOUNTER — Telehealth: Payer: Self-pay

## 2011-08-24 NOTE — ED Notes (Signed)
Left a message on voice mail asking how patient is feeling and advising to call back with any questions or concerns.  

## 2012-03-29 ENCOUNTER — Emergency Department
Admission: EM | Admit: 2012-03-29 | Discharge: 2012-03-29 | Disposition: A | Discharge status: Home / Self Care | Payer: BC Managed Care – PPO | Source: Home / Self Care | Attending: Family Medicine | Admitting: Family Medicine

## 2012-03-29 ENCOUNTER — Encounter: Payer: Self-pay | Admitting: *Deleted

## 2012-03-29 DIAGNOSIS — J111 Influenza due to unidentified influenza virus with other respiratory manifestations: Secondary | ICD-10-CM

## 2012-03-29 DIAGNOSIS — R5383 Other fatigue: Secondary | ICD-10-CM

## 2012-03-29 LAB — POCT INFLUENZA A/B: Influenza B, POC: NEGATIVE

## 2012-03-29 MED ORDER — BENZONATATE 200 MG PO CAPS: 200.0000 mg | ORAL_CAPSULE | Freq: Every day | ORAL | Status: DC

## 2012-03-29 MED ORDER — OSELTAMIVIR PHOSPHATE 75 MG PO CAPS: 75.0000 mg | ORAL_CAPSULE | Freq: Two times a day (BID) | ORAL | Status: DC

## 2012-03-29 MED ORDER — PREDNISONE 20 MG PO TABS: 20.0000 mg | ORAL_TABLET | Freq: Two times a day (BID) | ORAL | Status: DC

## 2012-03-29 NOTE — ED Notes (Signed)
Pt c/o fatigue, head pressure, cough, and sneezing x 2 days. She did not receive a flu vaccine.

## 2012-03-29 NOTE — ED Provider Notes (Signed)
History     CSN: [id number]  Arrival date & time 03/29/12  1435   First MD Initiated Contact with Patient 03/29/12 1517      Chief Complaint  Patient presents with  . Fever  . Nasal Congestion  . Cough      HPI Comments: Patient complains of 2 day history of flu-like symptoms with myalgias, fatigue, neck stiffness, mild sore throat, headache, nasal congestion, cough, and chills/sweats.  She has not had a flu vaccination  The history is provided by the patient.    Past Medical History  Diagnosis Date  . Anxiety     Past Surgical History  Procedure Date  . Tonsillectomy   . Appendectomy   . Tubal ligation     History reviewed. No pertinent family history.  History  Substance Use Topics  . Smoking status: Current Every Day Smoker -- 0.5 packs/day  . Smokeless tobacco: Not on file  . Alcohol Use: No    OB History    Grav Para Term Preterm Abortions TAB SAB Ect Mult Living                  Review of Systems + sore throat + cough No pleuritic pain No wheezing + nasal congestion + post-nasal drainage No sinus pain/pressure No itchy/red eyes ? earache No hemoptysis No SOB + fever, + chills No nausea No vomiting No abdominal pain No diarrhea No urinary symptoms No skin rashes + fatigue + myalgias + headache Used OTC meds without relief  Allergies  Review of patient's allergies indicates no known allergies.  Home Medications   Current Outpatient Rx  Name  Route  Sig  Dispense  Refill  . FLUOXETINE HCL 40 MG PO CAPS   Oral   Take 40 mg by mouth daily.         Marland Kitchen ALPRAZOLAM 0.5 MG PO TABS   Oral   Take 0.5 mg by mouth 3 (three) times daily.           Marland Kitchen BENZONATATE 200 MG PO CAPS   Oral   Take 1 capsule (200 mg total) by mouth at bedtime. Take as needed for cough   12 capsule   0   . OSELTAMIVIR PHOSPHATE 75 MG PO CAPS   Oral   Take 1 capsule (75 mg total) by mouth every 12 (twelve) hours.   10 capsule   0   . PREDNISONE 20 MG  PO TABS   Oral   Take 1 tablet (20 mg total) by mouth 2 (two) times daily.   10 tablet   0   . RANITIDINE HCL 75 MG/5ML PO SYRP   Oral   Take 0.5 mg by mouth 3 (three) times daily.             BP 104/65  Pulse 90  Temp 97.8 F (36.6 C) (Oral)  Resp 16  Ht 5' (1.524 m)  Wt 115 lb (52.164 kg)  BMI 22.46 kg/m2  SpO2 99%  Physical Exam Nursing notes and Vital Signs reviewed. Appearance:  Patient appears healthy, stated age, and in no acute distress Eyes:  Pupils are equal, round, and reactive to light and accomodation.  Extraocular movement is intact.  Conjunctivae are not inflamed  Ears:  Canals normal.  Tympanic membranes normal.  Nose:  Mildly congested turbinates.  No sinus tenderness.    Pharynx:  Normal Neck:  Supple.  Slightly tender shotty anterior/posterior nodes are palpated bilaterally  Lungs:  Clear to auscultation.  Breath sounds are equal.  Heart:  Regular rate and rhythm without murmurs, rubs, or gallops.  Abdomen:  Nontender without masses or hepatosplenomegaly.  Bowel sounds are present.  No CVA or flank tenderness.  Extremities:  No edema.  No calf tenderness Skin:  No rash present.   ED Course  Procedures  none   Labs Reviewed  POCT INFLUENZA A/B negative      1. Influenza-like illness       MDM  Although flu test negative, will begin empiric Tamiflu.  Prescription written for Benzonatate [facility]) to take at bedtime for night-time cough.  Take Mucinex D (guaifenesin with decongestant) twice daily for congestion.  Increase fluid intake, rest. May use Afrin nasal spray (or generic oxymetazoline) twice daily for about 5 days.  Also recommend using saline nasal spray several times daily and saline nasal irrigation (AYR is a common brand) Stop all antihistamines for now, and other non-prescription cough/cold preparations. Follow-up with family doctor if not improving 7 to 10 days.         Lattie Haw, MD 04/04/12 2132

## 2012-06-14 ENCOUNTER — Emergency Department
Admission: EM | Admit: 2012-06-14 | Discharge: 2012-06-14 | Disposition: A | Discharge status: Home / Self Care | Payer: BC Managed Care – PPO | Source: Home / Self Care | Attending: Family Medicine | Admitting: Family Medicine

## 2012-06-14 ENCOUNTER — Encounter: Payer: Self-pay | Admitting: *Deleted

## 2012-06-14 DIAGNOSIS — R300 Dysuria: Secondary | ICD-10-CM

## 2012-06-14 LAB — POCT URINALYSIS DIP (MANUAL ENTRY)
Bilirubin, UA: NEGATIVE
Glucose, UA: NEGATIVE
Nitrite, UA: NEGATIVE

## 2012-06-14 MED ORDER — CEPHALEXIN 500 MG PO CAPS: 500.0000 mg | ORAL_CAPSULE | Freq: Three times a day (TID) | ORAL | Status: AC

## 2012-06-14 MED ORDER — PHENAZOPYRIDINE HCL 200 MG PO TABS: 200.0000 mg | ORAL_TABLET | Freq: Three times a day (TID) | ORAL | Status: DC | PRN

## 2012-06-14 NOTE — ED Provider Notes (Signed)
History     CSN: [id number]  Arrival date & time 06/14/12  1324   First MD Initiated Contact with Patient 06/14/12 1351      Chief Complaint  Patient presents with  . Dysuria   HPI  DYSURIA Onset:  2 days Description: dysuria, increased urinary frequency  Modifying factors: none   Symptoms Urgency:  yes Frequency: yes  Hesitancy:  yes Hematuria:  no Flank Pain:  no Fever: no Nausea/Vomiting:  no Missed LMP: no STD exposure: no Discharge: no Irritants: no Rash: no  Red Flags   More than 3 UTI's last 12 months:  no PMH of  Diabetes or Immunosuppression:  no Renal Disease/Calculi: no3 Urinary Tract Abnormality:  no Instrumentation or Trauma: no    Past Medical History  Diagnosis Date  . Anxiety     Past Surgical History  Procedure Laterality Date  . Tonsillectomy    . Appendectomy    . Tubal ligation      History reviewed. No pertinent family history.  History  Substance Use Topics  . Smoking status: Current Every Day Smoker -- 0.50 packs/day  . Smokeless tobacco: Not on file  . Alcohol Use: No    OB History   Grav Para Term Preterm Abortions TAB SAB Ect Mult Living                  Review of Systems  All other systems reviewed and are negative.    Allergies  Review of patient's allergies indicates no known allergies.  Home Medications   Current Outpatient Rx  Name  Route  Sig  Dispense  Refill  . ALPRAZolam (XANAX) 0.5 MG tablet   Oral   Take 0.5 mg by mouth 3 (three) times daily.           . cephALEXin (KEFLEX) 500 MG capsule   Oral   Take 1 capsule (500 mg total) by mouth 3 (three) times daily.   21 capsule   0   . FLUoxetine (PROZAC) 40 MG capsule   Oral   Take 40 mg by mouth daily.         . phenazopyridine (PYRIDIUM) 200 MG tablet   Oral   Take 1 tablet (200 mg total) by mouth 3 (three) times daily as needed for pain.   10 tablet   0   . ranitidine (ZANTAC) 75 MG/5ML syrup   Oral   Take 0.5 mg by mouth 3  (three) times daily.             BP 109/71  Pulse 89  Temp(Src) 97.8 F (36.6 C) (Oral)  Ht 4' 11.5" (1.511 m)  Wt 121 lb (54.885 kg)  BMI 24.04 kg/m2  SpO2 99%  LMP 05/28/2012  Physical Exam  Constitutional: She appears well-developed and well-nourished.  HENT:  Head: Normocephalic and atraumatic.  Eyes: Conjunctivae are normal. Pupils are equal, round, and reactive to light.  Neck: Normal range of motion. Neck supple.  Cardiovascular: Normal rate and regular rhythm.   Pulmonary/Chest: Effort normal and breath sounds normal.  Abdominal: Soft. Bowel sounds are normal.  No flank pain  + suprapubic tenderness   Musculoskeletal: Normal range of motion.  Neurological: She is alert.  Skin: Skin is warm.    ED Course  Procedures (including critical care time)  Labs Reviewed  URINE CULTURE  POCT URINALYSIS DIP (MANUAL ENTRY)   No results found.   1. Dysuria       MDM  Will treat with  keflex and pyridium.  Urine culture. Discussed supportive care and infectious red flags.  Follow up as needed.     The patient and/or caregiver has been counseled thoroughly with regard to treatment plan and/or medications prescribed including dosage, schedule, interactions, rationale for use, and possible side effects and they verbalize understanding. Diagnoses and expected course of recovery discussed and will return if not improved as expected or if the condition worsens. Patient and/or caregiver verbalized understanding.             Doree Albee, MD 06/14/12 [phone]

## 2012-06-14 NOTE — ED Notes (Signed)
Arley c/o dysuria and polyuria x yesterday. Taken OTC cranberry.

## 2012-06-16 LAB — URINE CULTURE: Colony Count: >=100000

## 2012-06-20 ENCOUNTER — Telehealth: Payer: Self-pay | Admitting: *Deleted

## 2012-09-16 ENCOUNTER — Encounter: Payer: Self-pay | Admitting: Emergency Medicine

## 2012-09-16 ENCOUNTER — Emergency Department
Admission: EM | Admit: 2012-09-16 | Discharge: 2012-09-16 | Disposition: A | Discharge status: Home / Self Care | Payer: BC Managed Care – PPO | Source: Home / Self Care | Attending: Family Medicine | Admitting: Family Medicine

## 2012-09-16 DIAGNOSIS — F959 Tic disorder, unspecified: Secondary | ICD-10-CM

## 2012-09-16 MED ORDER — KETOROLAC TROMETHAMINE 0.4 % OP SOLN: 1.0000 [drp] | Freq: Four times a day (QID) | OPHTHALMIC | Status: DC

## 2012-09-16 NOTE — ED Notes (Signed)
Left eye twitching x 3 days, right ear "feels like a bug is in it"

## 2012-09-16 NOTE — ED Provider Notes (Signed)
History     CSN: [id number]  Arrival date & time 09/16/12  1740   First MD Initiated Contact with Patient 09/16/12 1756      Chief Complaint  Patient presents with  . Eye Problem       HPI Comments: Patient has noticed that her left upper and lower eyelids twitch intermittently for about 2 to 3 days.  No vision changes.  She also feels like an insect had entered her right ear canal; no earache.  No other neuro symptoms.  She admits that she has been under increased stress recently.  The history is provided by the patient.    Past Medical History  Diagnosis Date  . Anxiety     Past Surgical History  Procedure Laterality Date  . Tonsillectomy    . Appendectomy    . Tubal ligation      No pertinent family history  History  Substance Use Topics  . Smoking status: Current Every Day Smoker -- 0.50 packs/day for 20 years  . Smokeless tobacco: Not on file  . Alcohol Use: No    OB History   Grav Para Term Preterm Abortions TAB SAB Ect Mult Living                  Review of Systems  Constitutional: Positive for fatigue. Negative for fever, chills and activity change.  HENT: Negative for hearing loss, ear pain, congestion, facial swelling, trouble swallowing, sinus pressure, tinnitus and ear discharge.   Eyes: Negative for photophobia, pain, discharge, redness, itching and visual disturbance.  Respiratory: Negative.   Cardiovascular: Negative.   Gastrointestinal: Negative.   Genitourinary: Negative.   Musculoskeletal: Negative.   Skin: Negative.   Neurological: Negative for headaches.    Allergies  Review of patient's allergies indicates not on file.  Home Medications   Current Outpatient Rx  Name  Route  Sig  Dispense  Refill  . ALPRAZolam (XANAX) 0.5 MG tablet   Oral   Take 0.5 mg by mouth 3 (three) times daily.           Marland Kitchen FLUoxetine (PROZAC) 40 MG capsule   Oral   Take 40 mg by mouth daily.         Marland Kitchen ketorolac (ACULAR) 0.4 % SOLN   Left Eye  Place 1 drop into the left eye 4 (four) times daily.   5 mL   0   . phenazopyridine (PYRIDIUM) 200 MG tablet   Oral   Take 1 tablet (200 mg total) by mouth 3 (three) times daily as needed for pain.   10 tablet   0   . ranitidine (ZANTAC) 75 MG/5ML syrup   Oral   Take 0.5 mg by mouth 3 (three) times daily.             BP 134/71  Pulse 85  Temp(Src) 97.8 F (36.6 C) (Oral)  Ht 4\' 11"  (1.499 m)  Wt 111 lb (50.349 kg)  BMI 22.41 kg/m2  SpO2 99%  Physical Exam Nursing notes and Vital Signs reviewed. Appearance:  Patient appears healthy, stated age, and in no acute distress Eyes:  Pupils are equal, round, and reactive to light and accomodation.  Extraocular movement is intact.  Conjunctivae are not inflamed.  Fundi benign.  No eyelid tic noted Ears:  Canals normal.  Tympanic membranes normal. No foreign bodies noted in canals. Nose:  Normal turbinates.  No sinus tenderness.   Pharynx:  Normal Neck:  Supple.  No adenopathy Lungs:  Clear to auscultation.  Breath sounds are equal.  Heart:  Regular rate and rhythm without murmurs, rubs, or gallops.  Abdomen:  Nontender without masses or hepatosplenomegaly.  Bowel sounds are present.  No CVA or flank tenderness.  Extremities:  No edema.  No calf tenderness Skin:  No rash present. Neurologic:  Cranial nerves 2 through 12 are normal.  Patellar and elbow reflexes are normal.  Cerebellar function is intact (finger-to-nose and rapid alternating hand movement).  Gait and station are normal.     ED Course  Procedures  none      1. Facial tic, probably secondary to stress      MDM  Reassurance.  Followup with PCP if persists        Lattie Haw, MD 09/20/12 1122

## 2012-11-16 IMAGING — CR DG CHEST 2V
2 series · 2 of 2 positions shown · non-contrast
Comparison: None.

CLINICAL DATA: Recurrent cough, former smoking history

CHEST - 2 VIEW

[view not recorded (1 of 2)]
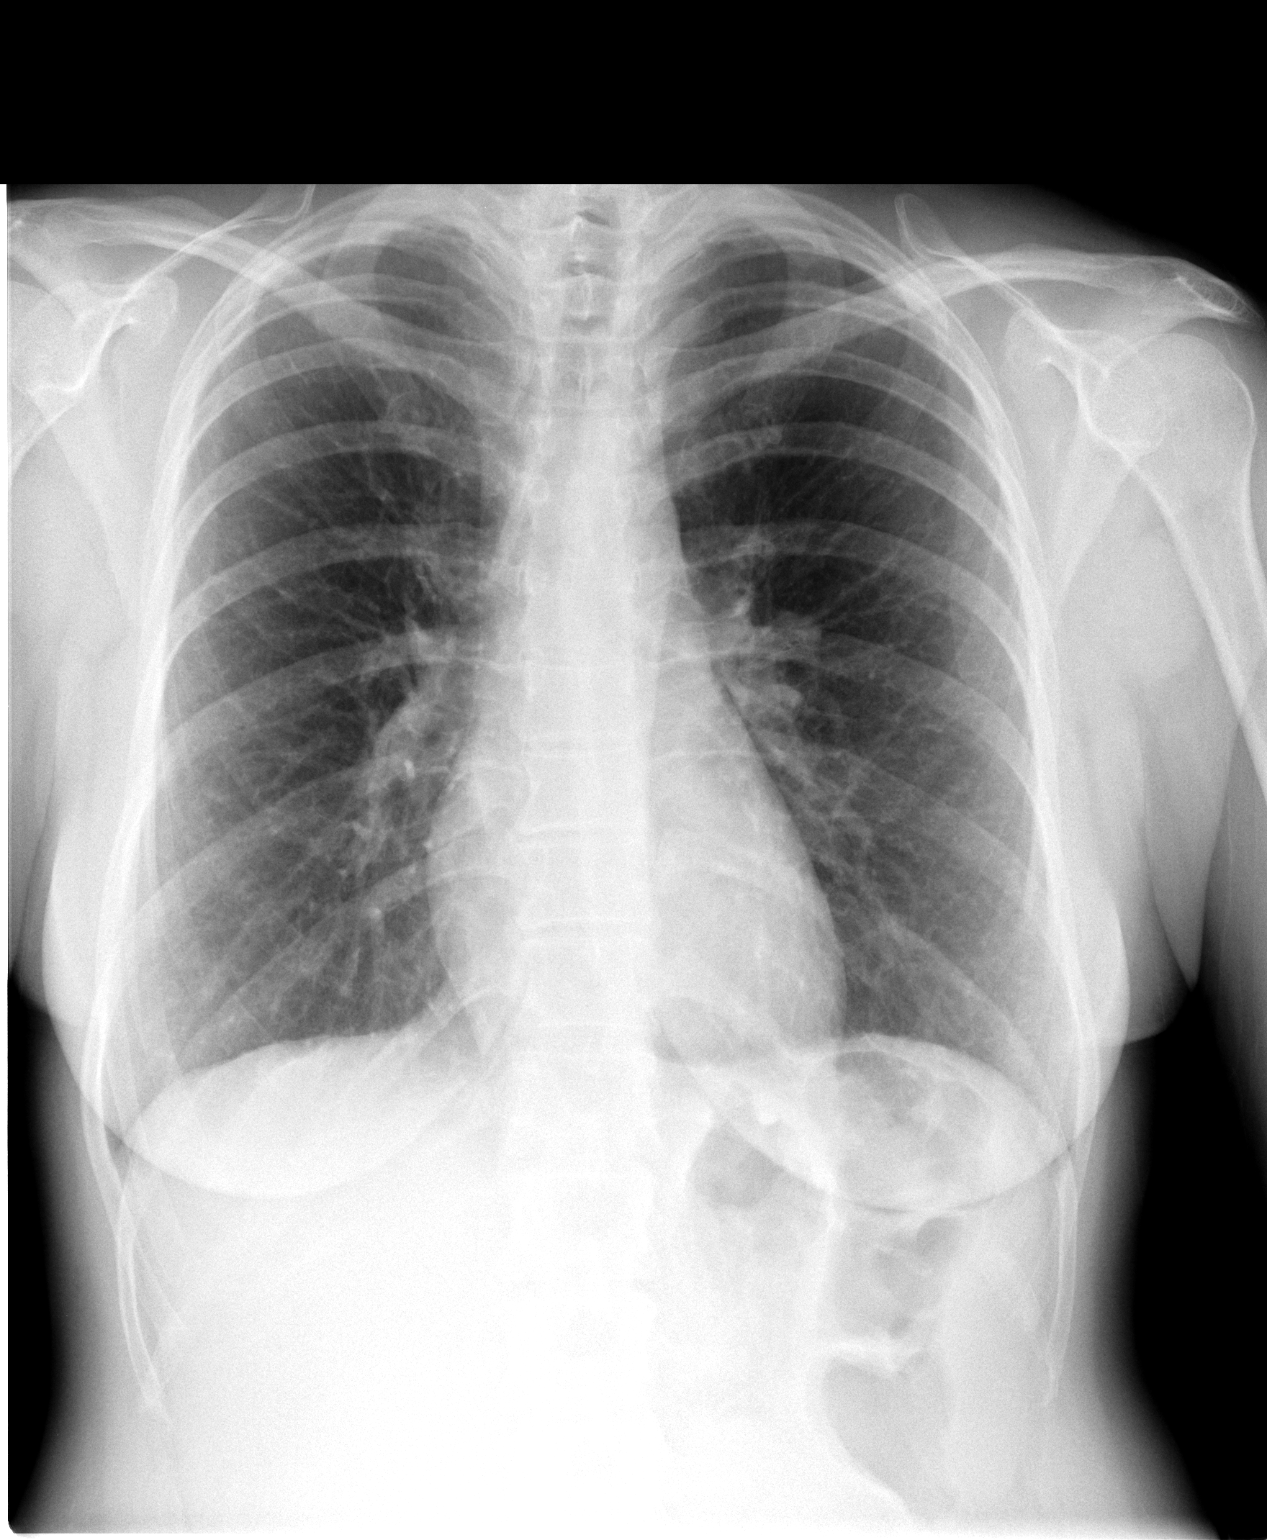

[view not recorded (2 of 2)]
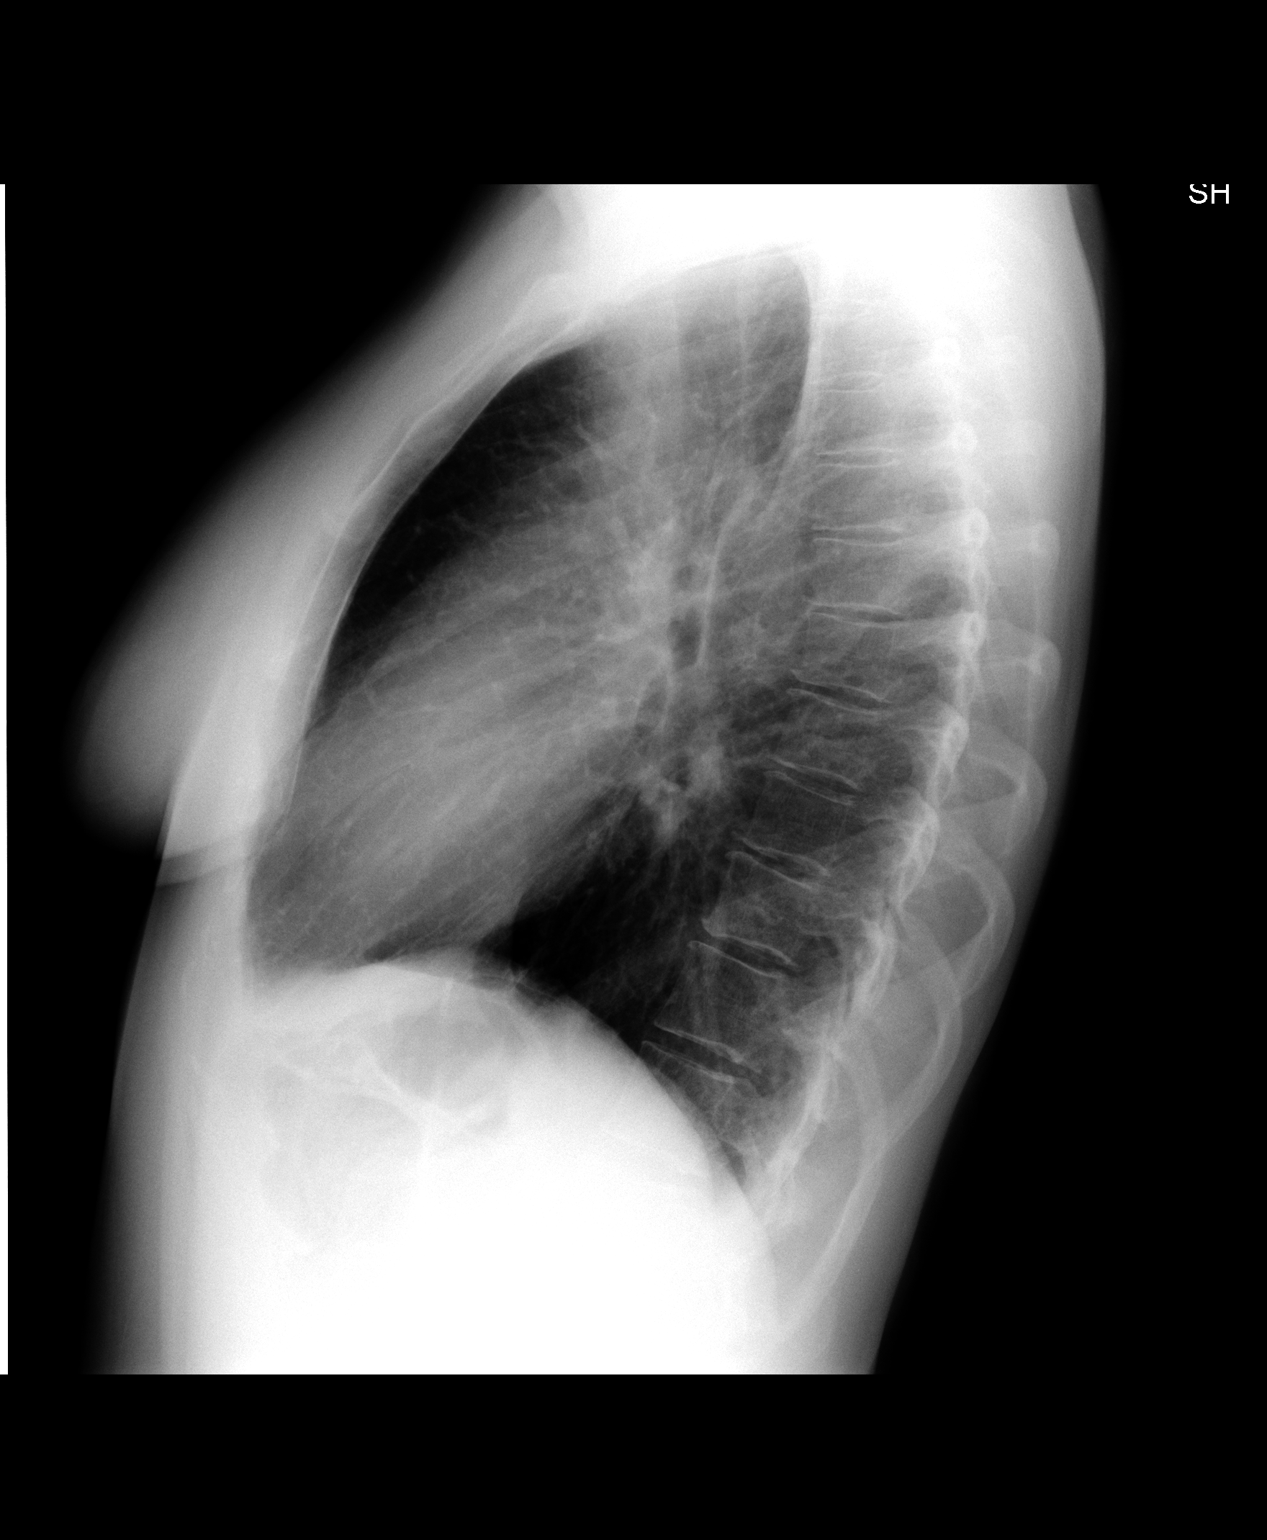

[2 of 2 positions shown; findings below may reference images not displayed]

FINDINGS: The lungs are clear.  There is mild peribronchial
thickening present.  Mediastinal contours appear normal.  The heart
is within normal limits in size.  No bony abnormality is seen.
IMPRESSION: No active lung disease.  Mild peribronchial thickening.

## 2015-07-08 ENCOUNTER — Encounter: Payer: Self-pay | Admitting: Emergency Medicine

## 2015-07-08 ENCOUNTER — Emergency Department (INDEPENDENT_AMBULATORY_CARE_PROVIDER_SITE_OTHER): Payer: Managed Care, Other (non HMO)

## 2015-07-08 ENCOUNTER — Emergency Department
Admission: EM | Admit: 2015-07-08 | Discharge: 2015-07-08 | Disposition: A | Discharge status: Home / Self Care | Payer: Managed Care, Other (non HMO) | Source: Home / Self Care | Attending: Family Medicine | Admitting: Family Medicine

## 2015-07-08 DIAGNOSIS — S63615A Unspecified sprain of left ring finger, initial encounter: Secondary | ICD-10-CM

## 2015-07-08 DIAGNOSIS — D1612 Benign neoplasm of short bones of left upper limb: Secondary | ICD-10-CM | POA: Diagnosis not present

## 2015-07-08 DIAGNOSIS — M25542 Pain in joints of left hand: Secondary | ICD-10-CM

## 2015-07-08 MED ORDER — HYDROCODONE-ACETAMINOPHEN 5-325 MG PO TABS: 1.0000 | ORAL_TABLET | Freq: Four times a day (QID) | ORAL | Status: DC | PRN

## 2015-07-08 MED ORDER — MELOXICAM 7.5 MG PO TABS: 7.5000 mg | ORAL_TABLET | Freq: Every day | ORAL | Status: DC

## 2015-07-08 NOTE — ED Notes (Signed)
Reports jamming left #4 finger yesterday; now extremely painful in most postions. Took ibuprofen at noon today.

## 2015-07-08 NOTE — ED Provider Notes (Signed)
CSN: [id number]     Arrival date & time 07/08/15  1421 History   First MD Initiated Contact with Patient 07/08/15 1517     Chief Complaint  Patient presents with  . Hand Pain   (Consider location/radiation/quality/duration/timing/severity/associated sxs/prior Treatment) HPI  The pt is a 50yo female presenting to [facility] with c/o Left 4th finger pain that started yesterday.  Pt was vacuuming and accidentally twisted her hand causing pain and swelling to her finger. She has limited ROM due to the pain.  She did take ibuprofen around 12PM today with minimal relief.  Pain is aching and sore, 10/10, worse with movement. No other injuries.   Past Medical History  Diagnosis Date  . Anxiety    Past Surgical History  Procedure Laterality Date  . Tonsillectomy    . Appendectomy    . Tubal ligation     History reviewed. No pertinent family history. Social History  Substance Use Topics  . Smoking status: Current Every Day Smoker -- 0.50 packs/day for 20 years  . Smokeless tobacco: None  . Alcohol Use: No   OB History    No data available     Review of Systems  Musculoskeletal: Positive for myalgias, joint swelling and arthralgias.  Skin: Positive for color change ( mild bruising). Negative for wound.  Neurological: Positive for weakness (Left 4th finger from pain). Negative for numbness.    Allergies  Review of patient's allergies indicates not on file.  Home Medications   Prior to Admission medications   Medication Sig Start Date End Date Taking? Authorizing Provider  amphetamine-dextroamphetamine (ADDERALL XR) 15 MG 24 hr capsule Take 15 mg by mouth every morning.   Yes Historical Provider, MD  ALPRAZolam Duanne Moron) 0.5 MG tablet Take 0.5 mg by mouth 3 (three) times daily.      Historical Provider, MD  FLUoxetine (PROZAC) 40 MG capsule Take 40 mg by mouth daily.    Historical Provider, MD  HYDROcodone-acetaminophen (NORCO/VICODIN) 5-325 MG tablet Take 1 tablet by mouth every 6 (six)  hours as needed for moderate pain or severe pain. 07/08/15   Noland Fordyce, PA-C  ketorolac (ACULAR) 0.4 % SOLN Place 1 drop into the left eye 4 (four) times daily. 09/16/12   Kandra Nicolas, MD  meloxicam (MOBIC) 7.5 MG tablet Take 1 tablet (7.5 mg total) by mouth daily. 07/08/15   Noland Fordyce, PA-C  phenazopyridine (PYRIDIUM) 200 MG tablet Take 1 tablet (200 mg total) by mouth 3 (three) times daily as needed for pain. 06/14/12   Deneise Lever, MD  ranitidine (ZANTAC) 75 MG/5ML syrup Take 0.5 mg by mouth 3 (three) times daily.      Historical Provider, MD   Meds Ordered and Administered this Visit  Medications - No data to display  BP 120/73 mmHg  Pulse 71  Temp(Src) 98.1 F (36.7 C) (Oral)  Resp 16  Ht 4\' 11"  (1.499 m)  Wt 112 lb (50.803 kg)  BMI 22.61 kg/m2  SpO2 100%  LMP 07/06/2015 No data found.   Physical Exam  Constitutional: She is oriented to person, place, and time. She appears well-developed and well-nourished.  HENT:  Head: Normocephalic and atraumatic.  Eyes: EOM are normal.  Neck: Normal range of motion.  Cardiovascular: Normal rate.   Left 4th finger: cap refill < 3 seconds  Pulmonary/Chest: Effort normal.  Musculoskeletal: She exhibits edema and tenderness.  Left 4th finger: mild edema with tenderness to MCP and PIP. Limited flexion due to pain. Full extension in  tact.  Neurological: She is alert and oriented to person, place, and time.  Normal sensation to Left 4th finger.  Skin: Skin is warm and dry. No erythema.  Left 4th finger. Skin in tact. Mild ecchymosis, no erythema or warmth.   Psychiatric: She has a normal mood and affect. Her behavior is normal.  Nursing note and vitals reviewed.   ED Course  Procedures (including critical care time)  Labs Review Labs Reviewed - No data to display  Imaging Review Dg Hand Complete Left  07/08/2015  CLINICAL DATA:  The patient jammed her left long finger yesterday. Pain with flexion. Initial encounter. EXAM:  LEFT HAND - COMPLETE 3+ VIEW COMPARISON:  None. FINDINGS: No acute bony or joint abnormality is identified. Lucent lesion in the base of the proximal phalanx of the ring finger has an appearance most consistent with an enchondroma. IMPRESSION: No acute abnormality. 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 Electronically Signed   By: Inge Rise M.D.   On: 07/08/2015 15:31     MDM   1. 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  Limited ROM due to pain.  Pulse and sensation in tact. No evidence of underlying infection.  Plain films: no acute abnormality. Likely enchondroma base of proximal phalanx of Left ring finger.  This is the are of swelling, bruising and tenderness on exam.   Will place pt in finger splint for comfort and have f/u with Sports Medicine in 1-2 weeks for recheck of symptoms. Pt given copy of radiology report and discussed findings of likely benign enchondroma.   Rx: norco (tramadol may interact with pt's Prozac) and meloxicam Advised not to drive or consume alcohol while taking norco.   Patient verbalized understanding and agreement with treatment plan.    Noland Fordyce, PA-C 07/08/15 [phone]

## 2015-07-08 NOTE — Discharge Instructions (Signed)
Norco/Vicodin (hydrocodone-acetaminophen) is a narcotic pain medication, do not combine these medications with others containing tylenol. While taking, do not drink alcohol, drive, or perform any other activities that requires focus while taking these medications.   Meloxicam (Mobic) is an antiinflammatory to help with pain and inflammation.  Do not take ibuprofen, Advil, Aleve, or any other medications that contain NSAIDs while taking meloxicam as this may cause stomach upset or even ulcers if taken in large amounts for an extended period of time.    Jammed Finger A jammed finger is an injury to the ligaments that support your finger bones. Ligaments are strong bands of tissue that connect bones and keep them in place. This injury happens when the ligaments are stretched beyond their normal range of motion (sprained). CAUSES  A jammed finger is caused by a hard direct hit to the tip of your finger that pushes your finger toward your hand.  RISK FACTORS This injury is more likely to happen if you play sports. SYMPTOMS  Symptoms of a jammed finger include:  Pain.  Swelling.  Discoloration and bruising around the joint.  Difficulty bending or straightening the finger.  Not being able to use the finger normally. DIAGNOSIS  A jammed finger is diagnosed with a medical history and physical exam. You may also have X-rays taken to check for a broken bone (fracture).  TREATMENT  Treatment for a jammed finger may include:  Wearing a splint.  Taping the injured finger to the fingers beside it (buddy taping).  Medicines used to treat pain. Depending on the type of injury, you may have to do exercises after your finger has begun to heal. This helps you regain strength and mobility in the finger.  HOME CARE INSTRUCTIONS   Take medicines only as directed by your health care provider.  Apply ice to the injured area:   Put ice in a plastic bag.   Place a towel between your skin and the  bag.   Leave the ice on for 20 minutes, 2-3 times per day.  Raise the injured area above the level of your heart while you are sitting or lying down.  Wear the splint or tape as directed by your health care provider. Remove it only as directed by your health care provider.  Rest your finger until your health care provider says you can move it again. Your finger may feel stiff and painful for a while.  Perform strengthening exercises as directed by your health care provider. It may help to start doing these exercises with your hand in a bowl of warm water.  Keep all follow-up visits as directed by your health care provider. This is important. SEEK MEDICAL CARE IF:  You have pain or swelling that is getting worse.  Your finger feels cold.  Your finger looks out of place at the joint (deformity).  You still cannot extend your finger after treatment.  You have a fever. SEEK IMMEDIATE MEDICAL CARE IF:   Even after loosening your splint, your finger:  Is very red and swollen.  Is white or blue.  Feels tingly or becomes numb.   This information is not intended to replace advice given to you by your health care provider. Make sure you discuss any questions you have with your health care provider.   Document Released: 09/18/2009 Document Revised: 04/21/2014 Document Reviewed: 02/01/2014 Elsevier Interactive Patient Education 2016 [employer]  Finger Sprain A finger sprain is a tear in one of the strong, fibrous tissues  that connect the bones (ligaments) in your finger. The severity of the sprain depends on how much of the ligament is torn. The tear can be either partial or complete. CAUSES  Often, sprains are a result of a fall or accident. If you extend your hands to catch an object or to protect yourself, the force of the impact causes the fibers of your ligament to stretch too much. This excess tension causes the fibers of your ligament to tear. SYMPTOMS  You may have some  loss of motion in your finger. Other symptoms include:  Bruising.  Tenderness.  Swelling. DIAGNOSIS  In order to diagnose finger sprain, your caregiver will physically examine your finger or thumb to determine how torn the ligament is. Your caregiver may also suggest an X-ray exam of your finger to make sure no bones are broken. TREATMENT  If your ligament is only partially torn, treatment usually involves keeping the finger in a fixed position (immobilization) for a short period. To do this, your caregiver will apply a bandage, cast, or splint to keep your finger from moving until it heals. For a partially torn ligament, the healing process usually takes 2 to 3 weeks. If your ligament is completely torn, you may need surgery to reconnect the ligament to the bone. After surgery a cast or splint will be applied and will need to stay on your finger or thumb for 4 to 6 weeks while your ligament heals. HOME CARE INSTRUCTIONS  Keep your injured finger elevated, when possible, to decrease swelling.  To ease pain and swelling, apply ice to your joint twice a day, for 2 to 3 days:  Put ice in a plastic bag.  Place a towel between your skin and the bag.  Leave the ice on for 15 minutes.  Only take over-the-counter or prescription medicine for pain as directed by your caregiver.  Do not wear rings on your injured finger.  Do not leave your finger unprotected until pain and stiffness go away (usually 3 to 4 weeks).  Do not allow your cast or splint to get wet. Cover your cast or splint with a plastic bag when you shower or bathe. Do not swim.  Your caregiver may suggest special exercises for you to do during your recovery to prevent or limit permanent stiffness. SEEK IMMEDIATE MEDICAL CARE IF:  Your cast or splint becomes damaged.  Your pain becomes worse rather than better. MAKE SURE YOU:  Understand these instructions.  Will watch your condition.  Will get help right away if you  are not doing well or get worse.   This information is not intended to replace advice given to you by your health care provider. Make sure you discuss any questions you have with your health care provider.   Document Released: 05/08/2004 Document Revised: 04/21/2014 Document Reviewed: 12/02/2010 Elsevier Interactive Patient Education [employer].

## 2015-12-13 ENCOUNTER — Encounter: Payer: Self-pay | Admitting: Emergency Medicine

## 2015-12-13 ENCOUNTER — Emergency Department
Admission: EM | Admit: 2015-12-13 | Discharge: 2015-12-13 | Disposition: A | Discharge status: Home / Self Care | Payer: Managed Care, Other (non HMO) | Source: Home / Self Care | Attending: Family Medicine | Admitting: Family Medicine

## 2015-12-13 DIAGNOSIS — J019 Acute sinusitis, unspecified: Secondary | ICD-10-CM | POA: Diagnosis not present

## 2015-12-13 MED ORDER — AMOXICILLIN-POT CLAVULANATE 875-125 MG PO TABS: 1.0000 | ORAL_TABLET | Freq: Two times a day (BID) | ORAL | 0 refills | Status: DC

## 2015-12-13 MED ORDER — PREDNISONE 20 MG PO TABS: ORAL_TABLET | ORAL | 0 refills | Status: DC

## 2015-12-13 NOTE — ED Triage Notes (Signed)
Patient reports onset of congestion, cough, headache 2 days ago; has taken Alka Seltzer plus; denies any known fever.

## 2015-12-13 NOTE — ED Provider Notes (Signed)
CSN: [id number]     Arrival date & time 12/13/15  1816 History   First MD Initiated Contact with Patient 12/13/15 1841     Chief Complaint  Patient presents with  . Nasal Congestion  . Cough  . Headache   (Consider location/radiation/quality/duration/timing/severity/associated sxs/prior Treatment) HPI Laurie Berry is a 50 y.o. female presenting to UC with c/o sudden onset nasal congestion with worsening sinus pain and pressure for 2 days.  Sinus pain is mild to moderate, worse with leaning her head forward. Hx of sinus infections in the past. Prednisone has helped.  She has associated mildly productive cough.  She has taken Alka Seltzer plus but denies relief.  Denies fever, chills, n/v/d. No sick contacts or recent travel.   Past Medical History:  Diagnosis Date  . Anxiety    Past Surgical History:  Procedure Laterality Date  . APPENDECTOMY    . TONSILLECTOMY    . TUBAL LIGATION     History reviewed. No pertinent family history. Social History  Substance Use Topics  . Smoking status: Current Every Day Smoker    Packs/day: 0.50    Years: 20.00  . Smokeless tobacco: Never Used  . Alcohol use No   OB History    No data available     Review of Systems  Constitutional: Negative for chills and fever.  HENT: Positive for congestion, postnasal drip and sinus pressure. Negative for rhinorrhea, sneezing, sore throat, trouble swallowing and voice change.   Respiratory: Positive for cough. Negative for shortness of breath.   Cardiovascular: Negative for chest pain and palpitations.  Gastrointestinal: Negative for diarrhea, nausea and vomiting.  Musculoskeletal: Negative for arthralgias and myalgias.  Neurological: Positive for headaches. Negative for dizziness and light-headedness.    Allergies  Review of patient's allergies indicates no known allergies.  Home Medications   Prior to Admission medications   Medication Sig Start Date End Date Taking? Authorizing Provider   ALPRAZolam Duanne Moron) 0.5 MG tablet Take 0.5 mg by mouth 3 (three) times daily.     Yes Historical Provider, MD  amphetamine-dextroamphetamine (ADDERALL XR) 15 MG 24 hr capsule Take 15 mg by mouth every morning.   Yes Historical Provider, MD  FLUoxetine (PROZAC) 40 MG capsule Take 40 mg by mouth daily.   Yes Historical Provider, MD  HYDROcodone-acetaminophen (NORCO/VICODIN) 5-325 MG tablet Take 1 tablet by mouth every 6 (six) hours as needed for moderate pain or severe pain. 07/08/15  Yes Noland Fordyce, PA-C  ketorolac (ACULAR) 0.4 % SOLN Place 1 drop into the left eye 4 (four) times daily. 09/16/12  Yes Kandra Nicolas, MD  meloxicam (MOBIC) 7.5 MG tablet Take 1 tablet (7.5 mg total) by mouth daily. 07/08/15  Yes Noland Fordyce, PA-C  phenazopyridine (PYRIDIUM) 200 MG tablet Take 1 tablet (200 mg total) by mouth 3 (three) times daily as needed for pain. 06/14/12  Yes Deneise Lever, MD  ranitidine (ZANTAC) 75 MG/5ML syrup Take 0.5 mg by mouth 3 (three) times daily.     Yes Historical Provider, MD  amoxicillin-clavulanate (AUGMENTIN) 875-125 MG tablet Take 1 tablet by mouth 2 (two) times daily. One po bid x 7 days 12/13/15   Noland Fordyce, PA-C  predniSONE (DELTASONE) 20 MG tablet 3 tabs po day one, then 2 po daily x 4 days 12/13/15   Noland Fordyce, PA-C   Meds Ordered and Administered this Visit  Medications - No data to display  BP 147/76   Pulse 97   Temp 98.8 F (37.1 C) (  Oral)   Resp 16   Ht 4\' 11"  (1.499 m)   Wt 102 lb (46.3 kg)   LMP 11/20/2015 (Approximate)   SpO2 100%   BMI 20.60 kg/m  No data found.   Physical Exam  Constitutional: She is oriented to person, place, and time. She appears well-developed and well-nourished. No distress.  HENT:  Head: Normocephalic and atraumatic.  Right Ear: Tympanic membrane normal.  Left Ear: Tympanic membrane normal.  Nose: Mucosal edema present. Right sinus exhibits maxillary sinus tenderness and frontal sinus tenderness. Left sinus exhibits  maxillary sinus tenderness and frontal sinus tenderness.  Mouth/Throat: Uvula is midline, oropharynx is clear and moist and mucous membranes are normal.  Eyes: EOM are normal.  Neck: Normal range of motion. Neck supple.  Cardiovascular: Normal rate and regular rhythm.   Pulmonary/Chest: Effort normal and breath sounds normal. No stridor. No respiratory distress. She has no wheezes. She has no rales.  Musculoskeletal: Normal range of motion.  Lymphadenopathy:    She has no cervical adenopathy.  Neurological: She is alert and oriented to person, place, and time.  Skin: Skin is warm and dry. She is not diaphoretic.  Psychiatric: She has a normal mood and affect. Her behavior is normal.  Nursing note and vitals reviewed.   Urgent Care Course   Clinical Course    Procedures (including critical care time)  Labs Review Labs Reviewed - No data to display  Imaging Review No results found.    MDM   1. Acute rhinosinusitis    Pt c/o worsening sinus pain and congestion for 2 days. Sinus tenderness on exam. Hx of sinus infections.  Due to severity of discomfort will start on Prednisone and Augmentin. Encouraged alternating acetaminophen and ibuprofen. May also use OTC sinus rinses. F/u with PCP in 5-7 days if not improving. Patient verbalized understanding and agreement with treatment plan.    Noland Fordyce, PA-C 12/13/15 [phone]

## 2016-09-01 ENCOUNTER — Encounter: Payer: Managed Care, Other (non HMO) | Admitting: Obstetrics & Gynecology

## 2016-10-02 IMAGING — CR DG HAND COMPLETE 3+V*L*
3 series · 3 of 3 positions shown · non-contrast
Comparison: None.

CLINICAL DATA: The patient jammed her left long finger yesterday.
Pain with flexion. Initial encounter.

EXAM:
LEFT HAND - COMPLETE 3+ VIEW

[hand pa]
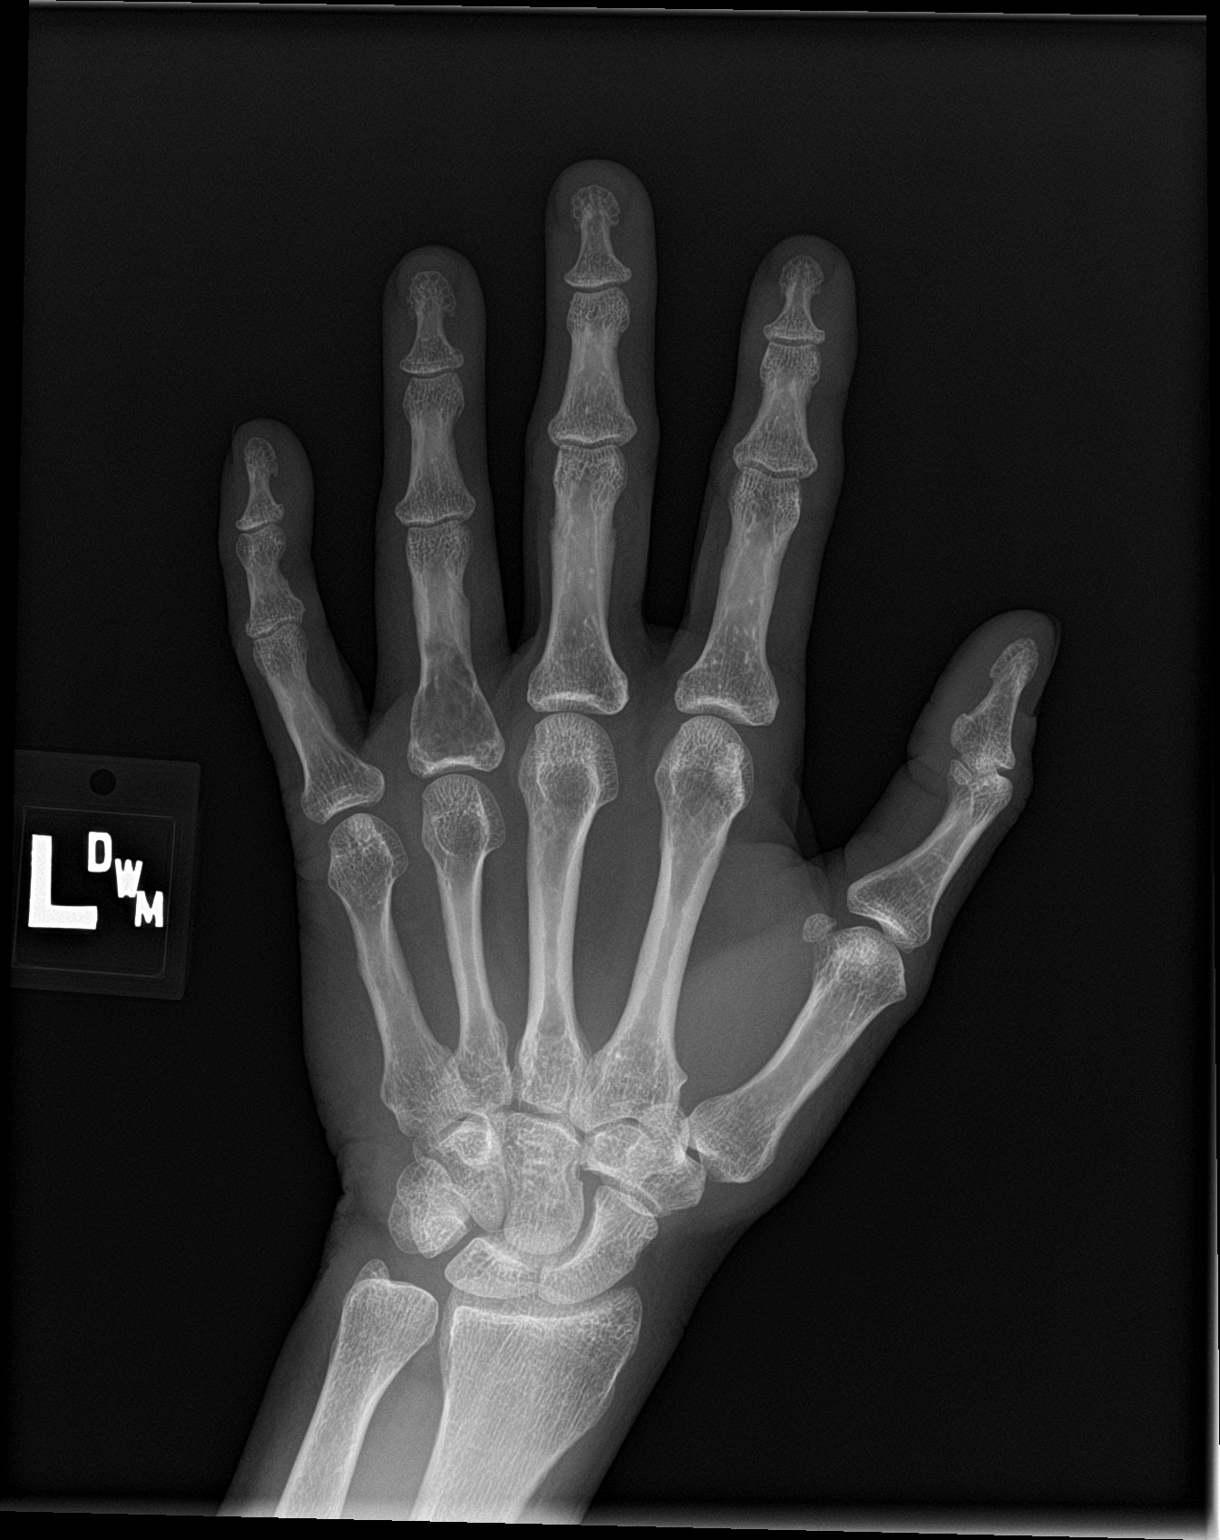

[hand obl]
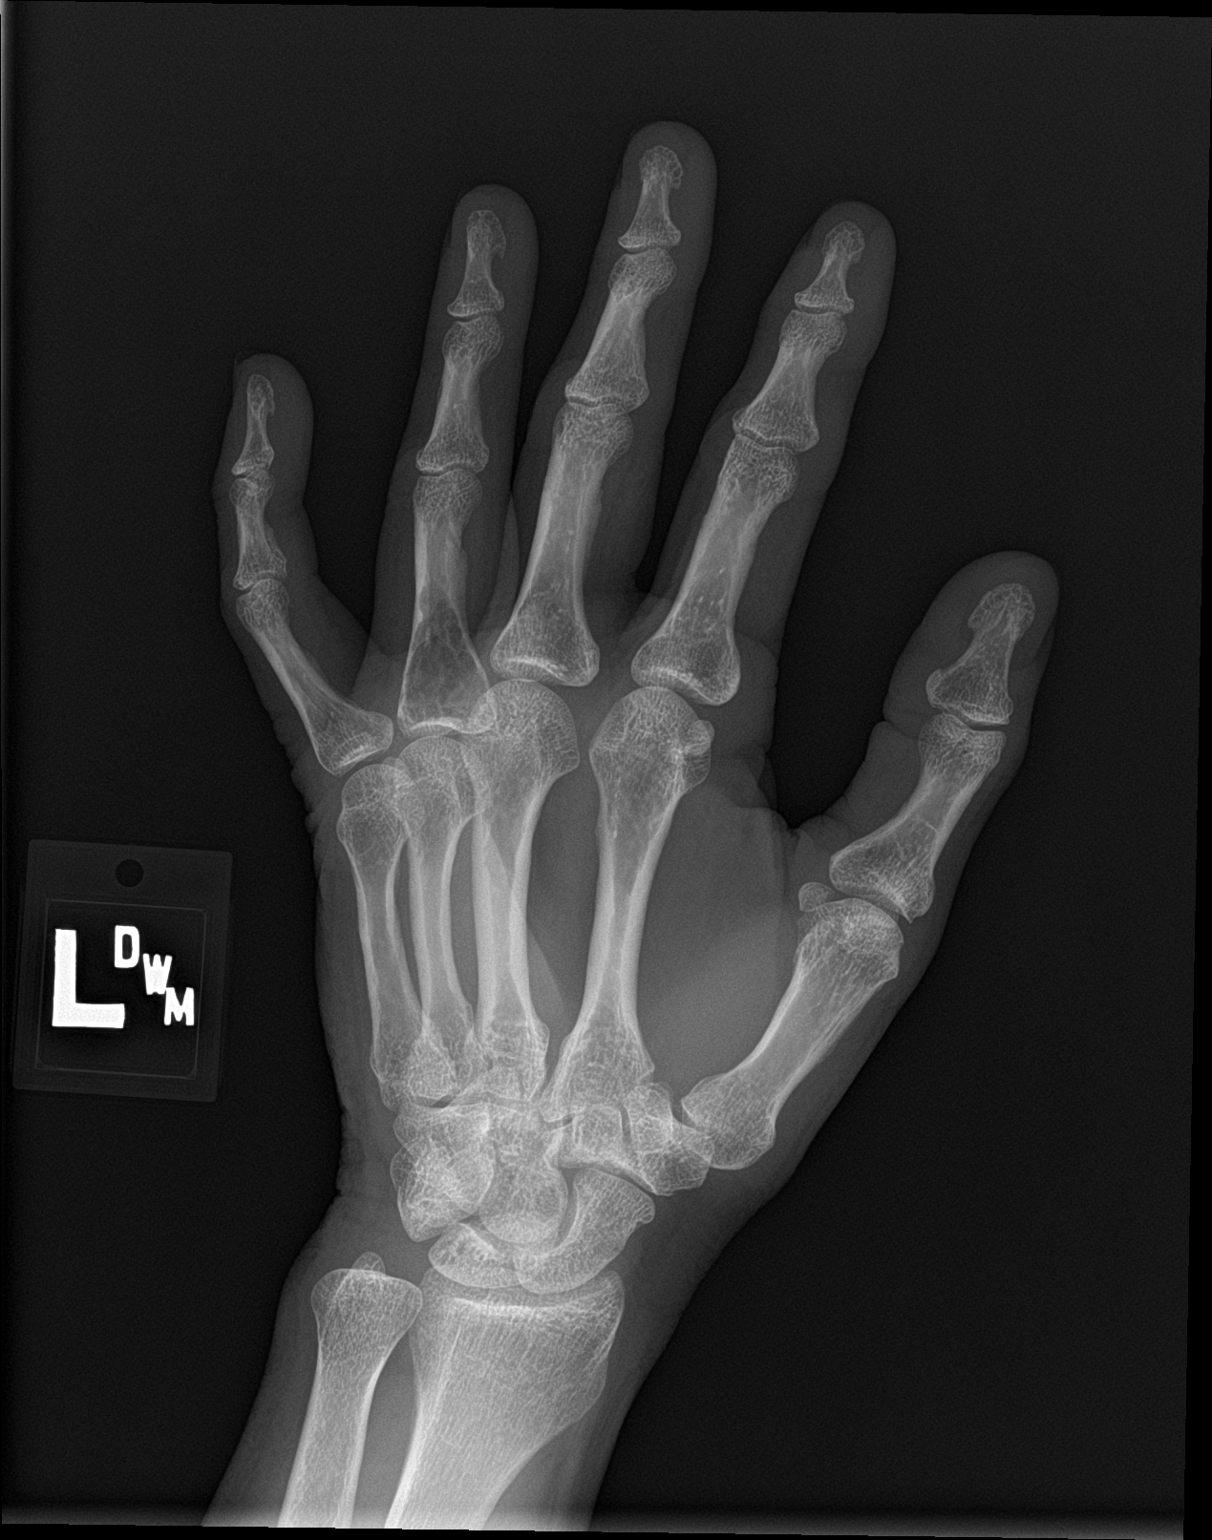

[hand lat]
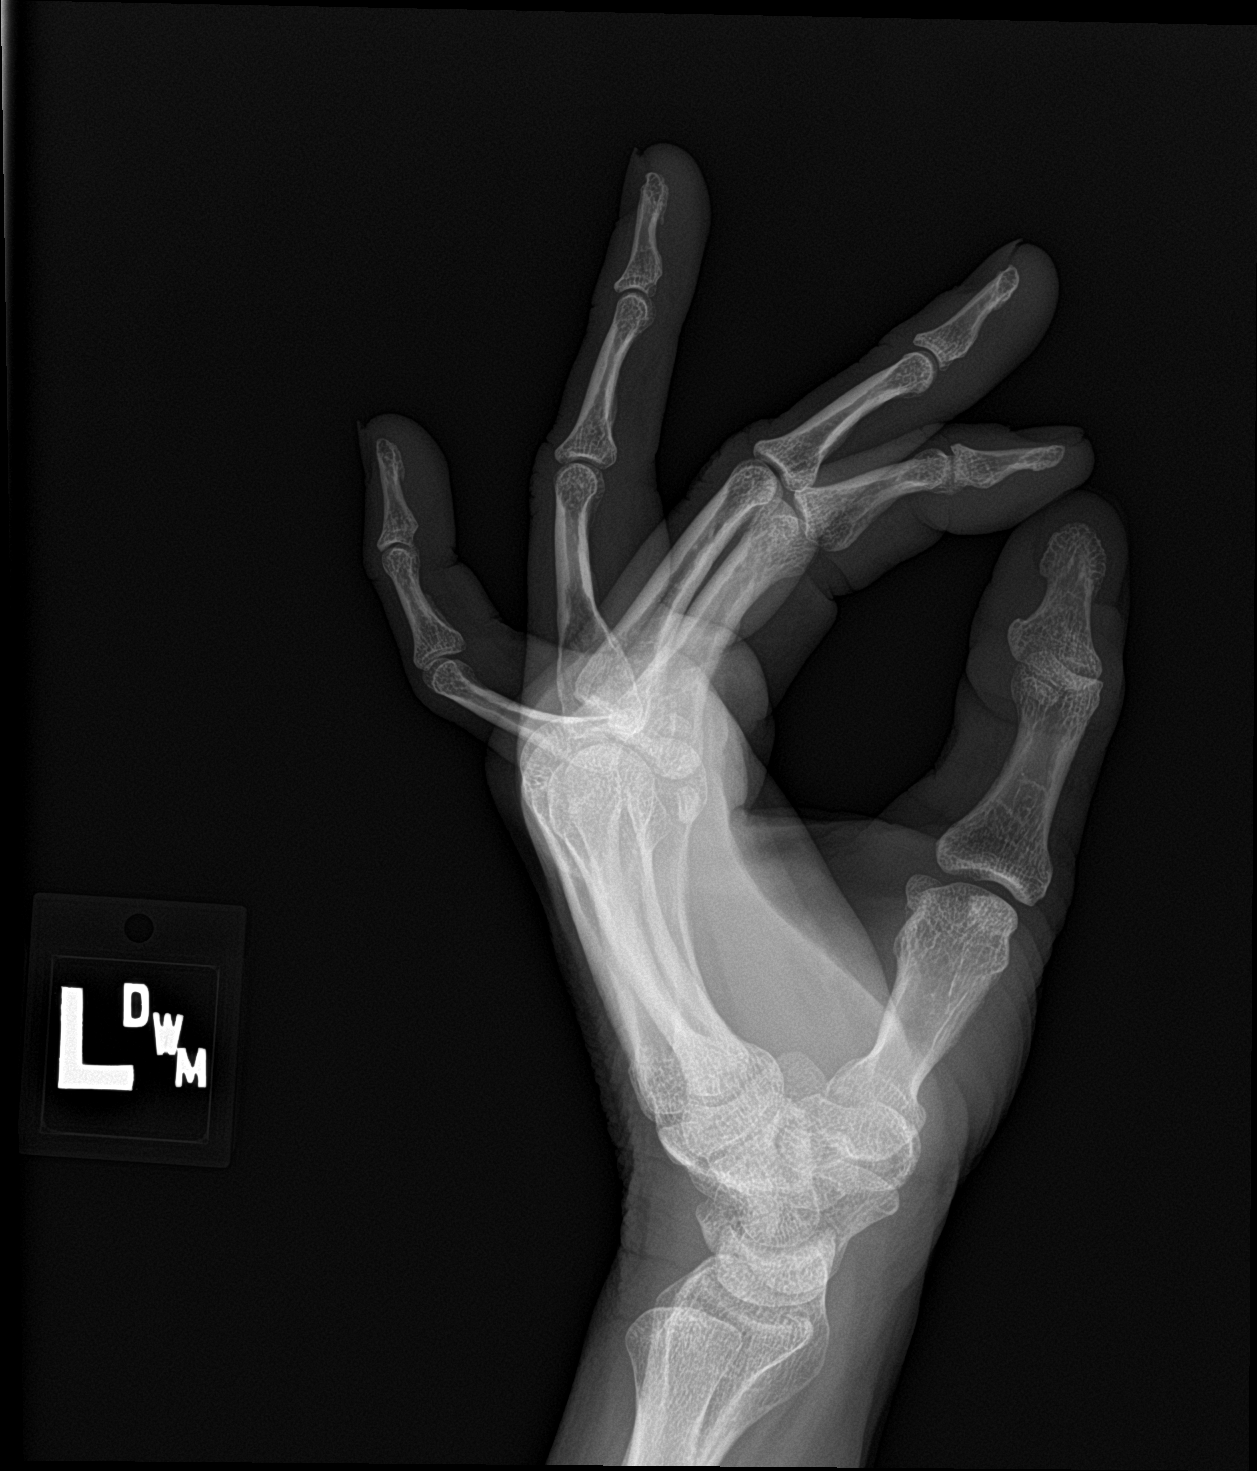

[3 of 3 positions shown; findings below may reference images not displayed]

FINDINGS: No acute bony or joint abnormality is identified. Lucent lesion in
the base of the proximal phalanx of the ring finger has an
appearance most consistent with an enchondroma.
IMPRESSION: No acute abnormality.

Likely enchondroma base of the proximal phalanx of the left ring
finger.

## 2020-03-05 ENCOUNTER — Emergency Department (INDEPENDENT_AMBULATORY_CARE_PROVIDER_SITE_OTHER)
Admission: EM | Admit: 2020-03-05 | Discharge: 2020-03-05 | Disposition: A | Discharge status: Home / Self Care | Payer: Medicare Other | Source: Home / Self Care

## 2020-03-05 DIAGNOSIS — R52 Pain, unspecified: Secondary | ICD-10-CM | POA: Diagnosis not present

## 2020-03-05 DIAGNOSIS — R6883 Chills (without fever): Secondary | ICD-10-CM

## 2020-03-05 LAB — POCT URINALYSIS DIP (MANUAL ENTRY)
Bilirubin, UA: NEGATIVE
Blood, UA: NEGATIVE
Glucose, UA: NEGATIVE mg/dL
Ketones, POC UA: NEGATIVE mg/dL
Leukocytes, UA: NEGATIVE
Nitrite, UA: NEGATIVE
Protein Ur, POC: NEGATIVE mg/dL
Spec Grav, UA: 1.025 (ref 1.010–1.025)
Urobilinogen, UA: 1 E.U./dL
pH, UA: 7 (ref 5.0–8.0)

## 2020-03-05 MED ORDER — CYCLOBENZAPRINE HCL 10 MG PO TABS: 10.0000 mg | ORAL_TABLET | Freq: Every day | ORAL | 0 refills | Status: DC

## 2020-03-05 MED ORDER — KETOROLAC TROMETHAMINE 30 MG/ML IJ SOLN
30.0000 mg | Freq: Once | INTRAMUSCULAR | Status: AC
  Administered 2020-03-05: 30 mg via INTRAMUSCULAR

## 2020-03-05 MED ORDER — DEXAMETHASONE SODIUM PHOSPHATE 10 MG/ML IJ SOLN
10.0000 mg | Freq: Once | INTRAMUSCULAR | Status: AC
  Administered 2020-03-05: 10 mg via INTRAMUSCULAR

## 2020-03-05 NOTE — ED Provider Notes (Signed)
[name]    CSN: [id number] Arrival date & time: 03/05/20  1348      History   Chief Complaint Chief Complaint  Patient presents with  . Chills  . Generalized Body Aches    HPI Laurie Berry is a 54 y.o. female.   HPI  Patient presents today for evaluation of 2-3 days of generalized body aches and chills. She denies any sick contact. She endorses poor appetite and fatigue although maintains her sense of taste and smell. She I unvaccinated against COVID and flu. She has taken tylenol today for aches and denies fever.  Denies SOB, chest tightness, or any other URI symptoms. Patient is high risk for COVID complication due to current smoker and COPD.   Past Medical History:  Diagnosis Date  . Anxiety     Patient Active Problem List   Diagnosis Date Noted  . BRONCHITIS, ACUTE 01/19/2011  . ANXIETY 03/27/2010  . DEPRESSION 03/27/2010  . UPPER RESPIRATORY INFECTION, ACUTE, WITH BRONCHITIS 03/27/2010    Past Surgical History:  Procedure Laterality Date  . APPENDECTOMY    . TONSILLECTOMY    . TUBAL LIGATION      OB History   No obstetric history on file.      Home Medications    Prior to Admission medications   Medication Sig Start Date End Date Taking? Authorizing Provider  ALPRAZolam Duanne Moron) 0.5 MG tablet Take 0.5 mg by mouth 3 (three) times daily.     Yes [provider]  amphetamine-dextroamphetamine (ADDERALL XR) 15 MG 24 hr capsule Take 15 mg by mouth every morning.   Yes [provider]  FLUoxetine (PROZAC) 40 MG capsule Take 40 mg by mouth daily.   Yes [provider]  amoxicillin-clavulanate (AUGMENTIN) 875-125 MG tablet Take 1 tablet by mouth 2 (two) times daily. One po bid x 7 days 12/13/15   Noe Gens, PA-C  HYDROcodone-acetaminophen (NORCO/VICODIN) 5-325 MG tablet Take 1 tablet by mouth every 6 (six) hours as needed for moderate pain or severe pain. 07/08/15   Noe Gens, PA-C  ketorolac (ACULAR) 0.4 %  SOLN Place 1 drop into the left eye 4 (four) times daily. 09/16/12   Kandra Nicolas, MD  meloxicam (MOBIC) 7.5 MG tablet Take 1 tablet (7.5 mg total) by mouth daily. 07/08/15   Noe Gens, PA-C  phenazopyridine (PYRIDIUM) 200 MG tablet Take 1 tablet (200 mg total) by mouth 3 (three) times daily as needed for pain. 06/14/12   Deneise Lever, MD  predniSONE (DELTASONE) 20 MG tablet 3 tabs po day one, then 2 po daily x 4 days 12/13/15   Noe Gens, PA-C  ranitidine (ZANTAC) 75 MG/5ML syrup Take 0.5 mg by mouth 3 (three) times daily.      [provider]    Family History Family History  Problem Relation Age of Onset  . Healthy Mother   . Healthy Father     Social History Social History   Tobacco Use  . Smoking status: Current Every Day Smoker    Packs/day: 0.50    Years: 20.00    Pack years: 10.00  . Smokeless tobacco: Never Used  Substance Use Topics  . Alcohol use: No  . Drug use: No     Allergies   Patient has no known allergies.   Review of Systems Review of Systems Pertinent negatives listed in HPI Physical Exam Triage Vital Signs ED Triage Vitals  Enc Vitals Group  BP 03/05/20 1447 (!) 152/83     Pulse Rate 03/05/20 1447 88     Resp 03/05/20 1447 16     Temp 03/05/20 1447 98.3 F (36.8 C)     Temp Source 03/05/20 1447 Oral     SpO2 03/05/20 1447 99 %     Weight --      Height --      Head Circumference --      Peak Flow --      Pain Score 03/05/20 1444 9     Pain Loc --      Pain Edu? --      Excl. in [place]? --    No data found.  Updated Vital Signs BP (!) 152/83 (BP Location: Right Arm)   Pulse 88   Temp 98.3 F (36.8 C) (Oral)   Resp 16   LMP 11/20/2015 (Approximate)   SpO2 99%   Visual Acuity Right Eye Distance:   Left Eye Distance:   Bilateral Distance:    Right Eye Near:   Left Eye Near:    Bilateral Near:     Physical Exam General appearance: alert, thin appearance, ill-appearing, no distress Head: Normocephalic,  without obvious abnormality, atraumatic Respiratory: Respirations even and unlabored, normal respiratory rate Heart: rate and rhythm normal. No gallop or murmurs noted on exam  Extremities: No gross deformities Skin: Skin color, texture, turgor normal. No rashes seen  Psych: Appropriate mood and affect. UC Treatments / Results  Labs (all labs ordered are listed, but only abnormal results are displayed) Labs Reviewed - No data to display  EKG   Radiology No results found.  Procedures Procedures (including critical care time)  Medications Ordered in UC Medications - No data to display  Initial Impression / Assessment and Plan / UC Course  I have reviewed the triage vital signs and the nursing notes.  Pertinent labs & imaging results that were available during my care of the patient were reviewed by me and considered in my medical decision making (see chart for details).     Symptoms consistent with viral illness. Given unvaccinated status recommend respiratory panel testing. Encourage self quarantine until results are known. For management of body aches patient given Toradol and Decadron IM in clinic. Cyclobenzaprine prescribed for body aches at home. Follow-up with PCP as needed. Final Clinical Impressions(s) / UC Diagnoses   Final diagnoses:  Generalized body aches  Chills   Discharge Instructions   None    ED Prescriptions    Medication Sig Dispense Auth. Provider   cyclobenzaprine (FLEXERIL) 10 MG tablet Take 1 tablet (10 mg total) by mouth at bedtime. 20 tablet Scot Jun, FNP     PDMP not reviewed this encounter.   Scot Jun, FNP 03/10/20 [phone]

## 2020-03-05 NOTE — ED Triage Notes (Signed)
Patient presents to Urgent Care with complaints of chills and generalized body aches since about 3 days ago. Patient reports she had some bilateral flank pain about a week ago but it went away, requesting prednisone. Pt denies fevers, pt has not been vaccinated for the covid or for the flu, has been taking tylenol and drinking gatorade.

## 2020-03-07 LAB — COVID-19, FLU A+B AND RSV
Influenza A, NAA: NOT DETECTED
Influenza B, NAA: NOT DETECTED
RSV, NAA: NOT DETECTED
SARS-CoV-2, NAA: DETECTED — AB

## 2021-05-29 ENCOUNTER — Other Ambulatory Visit: Payer: Self-pay

## 2021-05-29 ENCOUNTER — Ambulatory Visit (INDEPENDENT_AMBULATORY_CARE_PROVIDER_SITE_OTHER): Payer: Medicare (Managed Care) | Admitting: Family Medicine

## 2021-05-29 ENCOUNTER — Encounter: Payer: Self-pay | Admitting: Family Medicine

## 2021-05-29 VITALS — BP 149/88 | HR 81 | Temp 98.1°F | Ht 59.0 in | Wt 115.2 lb

## 2021-05-29 DIAGNOSIS — Z0000 Encounter for general adult medical examination without abnormal findings: Secondary | ICD-10-CM | POA: Diagnosis not present

## 2021-05-29 DIAGNOSIS — M19042 Primary osteoarthritis, left hand: Secondary | ICD-10-CM

## 2021-05-29 DIAGNOSIS — R030 Elevated blood-pressure reading, without diagnosis of hypertension: Secondary | ICD-10-CM

## 2021-05-29 DIAGNOSIS — Z13220 Encounter for screening for lipoid disorders: Secondary | ICD-10-CM | POA: Diagnosis not present

## 2021-05-29 DIAGNOSIS — M19041 Primary osteoarthritis, right hand: Secondary | ICD-10-CM

## 2021-05-29 DIAGNOSIS — F339 Major depressive disorder, recurrent, unspecified: Secondary | ICD-10-CM | POA: Diagnosis not present

## 2021-05-29 MED ORDER — MELOXICAM 15 MG PO TABS: 15.0000 mg | ORAL_TABLET | Freq: Every day | ORAL | 3 refills | Status: DC | PRN

## 2021-05-29 NOTE — Assessment & Plan Note (Signed)
Managed by psychiatry.  Fairly stable with current medications.  She does have a pending referral to see a therapist.

## 2021-05-29 NOTE — Assessment & Plan Note (Signed)
Adding meloxicam as needed.

## 2021-05-29 NOTE — Patient Instructions (Signed)
Very nice to meet you today Try meloxicam as needed for arthritis.  Please schedule a physical, we'll review your labs at this visit.

## 2021-05-29 NOTE — Progress Notes (Signed)
Laurie Berry - 56 y.o. female MRN [id number]  Date of birth: [birth year]  Subjective Chief Complaint  Patient presents with   Establish Care    HPI Laurie Berry is a 56 year old female here today for initial visit to establish care.  She has past medical history significant for ADD, depression and anxiety.   Psychotropic medications are managed by psychiatry.  She reports losing her husband approximately 3 years ago to suicide.  In addition to this she reports much stress related to her daughter's ongoing substance abuse and health issues as well as having to care for her granddaughter.  Who has chromosomal abnormality with some physical and mental disabilities.  She has upcoming court hearing regarding her granddaughter and shared custody with her biological father.    She reports having some joint pain in her hands over the past several weeks.  She has been taking Goody powders as needed.  ROS:  A comprehensive ROS was completed and negative except as noted per HPI Skin No Known Allergies  Past Medical History:  Diagnosis Date   Anxiety     Past Surgical History:  Procedure Laterality Date   APPENDECTOMY     TONSILLECTOMY     TUBAL LIGATION      Social History   Socioeconomic History   Marital status: Married    Spouse name: Not on file   Number of children: Not on file   Years of education: Not on file   Highest education level: Not on file  Occupational History   Not on file  Tobacco Use   Smoking status: Every Day    Packs/day: 0.50    Years: 20.00    Pack years: 10.00    Types: Cigarettes   Smokeless tobacco: Never  Substance and Sexual Activity   Alcohol use: No   Drug use: No   Sexual activity: Not on file  Other Topics Concern   Not on file  Social History Narrative   Not on file   Social Determinants of Health   Financial Resource Strain: Not on file  Food Insecurity: Not on file  Transportation Needs: Not on file  Physical Activity: Not on file   Stress: Not on file  Social Connections: Not on file    Family History  Problem Relation Age of Onset   Healthy Mother    Healthy Father     Health Maintenance  Topic Date Due   HIV Screening  Never done   Hepatitis C Screening  Never done   PAP SMEAR-Modifier  05/29/2021 (Originally 09/13/1985)   INFLUENZA VACCINE  07/12/2021 (Originally 11/12/2020)   MAMMOGRAM  05/29/2022 (Originally 09/14/2014)   COLONOSCOPY (Pts 45-56yrs Insurance coverage will need to be confirmed)  05/29/2022 (Originally 09/13/2009)   COVID-19 Vaccine (1) 06/15/2022 (Originally 03/15/1966)   Zoster Vaccines- Shingrix (1 of 2) 08/27/2022 (Originally 09/14/2014)   TETANUS/TDAP  02/08/2023   HPV VACCINES  Aged Out     ----------------------------------------------------------------------------------------------------------------------------------------------------------------------------------------------------------------- Physical Exam BP (!) 149/88 (BP Location: Left Arm, Patient Position: Sitting, Cuff Size: Small)    Pulse 81    Temp 98.1 F (36.7 C)    Ht 4\' 11"  (1.499 m)    Wt 115 lb 3.2 oz (52.3 kg)    LMP 11/20/2015 (Approximate)    SpO2 100%    BMI 23.27 kg/m   Physical Exam Constitutional:      Appearance: Normal appearance.  Neurological:     General: No focal deficit present.     Mental Status: She is alert.  Psychiatric:        Mood and Affect: Mood normal.        Behavior: Behavior normal.    ------------------------------------------------------------------------------------------------------------------------------------------------------------------------------------------------------------------- Assessment and Plan  Major depression, recurrent, chronic ([place]) Managed by psychiatry.  Fairly stable with current medications.  She does have a pending referral to see a therapist.  Elevated blood pressure reading Blood pressure elevated today.  Updated labs ordered and will review at upcoming  annual exam.  Osteoarthritis of hands, bilateral Adding meloxicam as needed.   Meds ordered this encounter  Medications   meloxicam (MOBIC) 15 MG tablet    Sig: Take 1 tablet (15 mg total) by mouth daily as needed for pain.    Dispense:  30 tablet    Refill:  3    No follow-ups on file.    This visit occurred during the SARS-CoV-2 public health emergency.  Safety protocols were in place, including screening questions prior to the visit, additional usage of staff PPE, and extensive cleaning of exam room while observing appropriate contact time as indicated for disinfecting solutions.

## 2021-05-29 NOTE — Assessment & Plan Note (Signed)
Blood pressure elevated today.  Updated labs ordered and will review at upcoming annual exam.

## 2021-05-30 LAB — CBC WITH DIFFERENTIAL/PLATELET
Absolute Monocytes: 447 cells/uL (ref 200–950)
Basophils Absolute: 103 cells/uL (ref 0–200)
Basophils Relative: 1.2 %
Eosinophils Absolute: 327 cells/uL (ref 15–500)
Eosinophils Relative: 3.8 %
HCT: 43.5 % (ref 35.0–45.0)
Hemoglobin: 14.4 g/dL (ref 11.7–15.5)
Lymphs Abs: 1840 cells/uL (ref 850–3900)
MCH: 30.1 pg (ref 27.0–33.0)
MCHC: 33.1 g/dL (ref 32.0–36.0)
MCV: 91 fL (ref 80.0–100.0)
MPV: 10.2 fL (ref 7.5–12.5)
Monocytes Relative: 5.2 %
Neutro Abs: 5882 cells/uL (ref 1500–7800)
Neutrophils Relative %: 68.4 %
Platelets: 314 10*3/uL (ref 140–400)
RBC: 4.78 10*6/uL (ref 3.80–5.10)
RDW: 12.6 % (ref 11.0–15.0)
Total Lymphocyte: 21.4 %
WBC: 8.6 10*3/uL (ref 3.8–10.8)

## 2021-05-30 LAB — COMPLETE METABOLIC PANEL WITH GFR
AG Ratio: 1.8 (calc) (ref 1.0–2.5)
ALT: 19 U/L (ref 6–29)
AST: 20 U/L (ref 10–35)
Albumin: 4.8 g/dL (ref 3.6–5.1)
Alkaline phosphatase (APISO): 67 U/L (ref 37–153)
BUN/Creatinine Ratio: 16 (calc) (ref 6–22)
BUN: 17 mg/dL (ref 7–25)
CO2: 31 mmol/L (ref 20–32)
Calcium: 10.2 mg/dL (ref 8.6–10.4)
Chloride: 103 mmol/L (ref 98–110)
Creat: 1.05 mg/dL — ABNORMAL HIGH (ref 0.50–1.03)
Globulin: 2.6 g/dL (calc) (ref 1.9–3.7)
Glucose, Bld: 88 mg/dL (ref 65–99)
Potassium: 4.4 mmol/L (ref 3.5–5.3)
Sodium: 142 mmol/L (ref 135–146)
Total Bilirubin: 0.7 mg/dL (ref 0.2–1.2)
Total Protein: 7.4 g/dL (ref 6.1–8.1)
eGFR: 62 mL/min/{1.73_m2} (ref >=60)

## 2021-05-30 LAB — TSH: TSH: 0.34 mIU/L — ABNORMAL LOW (ref 0.40–4.50)

## 2021-05-30 LAB — LIPID PANEL W/REFLEX DIRECT LDL
Cholesterol: 250 mg/dL — ABNORMAL HIGH (ref <200)
HDL: 41 mg/dL — ABNORMAL LOW (ref >=50)
LDL Cholesterol (Calc): 163 mg/dL (calc) — ABNORMAL HIGH
Non-HDL Cholesterol (Calc): 209 mg/dL (calc) — ABNORMAL HIGH (ref <130)
Total CHOL/HDL Ratio: 6.1 (calc) — ABNORMAL HIGH (ref <5.0)
Triglycerides: 302 mg/dL — ABNORMAL HIGH (ref <150)

## 2021-08-07 ENCOUNTER — Encounter: Payer: Medicare (Managed Care) | Admitting: Family Medicine

## 2022-04-19 ENCOUNTER — Encounter: Payer: Self-pay | Admitting: Emergency Medicine

## 2022-04-19 ENCOUNTER — Ambulatory Visit
Admission: EM | Admit: 2022-04-19 | Discharge: 2022-04-19 | Disposition: A | Discharge status: Home / Self Care | Payer: Medicare (Managed Care) | Attending: Family Medicine | Admitting: Family Medicine

## 2022-04-19 DIAGNOSIS — B028 Zoster with other complications: Secondary | ICD-10-CM

## 2022-04-19 DIAGNOSIS — B0223 Postherpetic polyneuropathy: Secondary | ICD-10-CM | POA: Diagnosis not present

## 2022-04-19 MED ORDER — GABAPENTIN 300 MG PO CAPS: 300.0000 mg | ORAL_CAPSULE | Freq: Three times a day (TID) | ORAL | 0 refills | Status: DC

## 2022-04-19 MED ORDER — HYDROXYZINE HCL 25 MG PO TABS: 25.0000 mg | ORAL_TABLET | Freq: Four times a day (QID) | ORAL | 0 refills | Status: DC | PRN
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
## 2022-04-19 NOTE — Discharge Instructions (Signed)
Hydroxyzine is an antihistamine like Benadryl.  Take it for itching.  May cause drowsiness.  It is good to take at bedtime  Gabapentin is a medication to reduce nerve pain.  Take at bedtime.  May take up to 3 times a day.  May cause drowsiness.  The drowsiness does wear off after you have taken it a few days  Call Dr. Zigmund Daniel office Monday morning to set up an appointment within the next 2 weeks

## 2022-04-19 NOTE — ED Provider Notes (Signed)
[name]    CSN: [id number] Arrival date & time: 04/19/22  1041      History   Chief Complaint Chief Complaint  Patient presents with   Rash    HPI Laurie Berry is a 57 y.o. female.   HPI  Patient went to the emergency room on 04/04/2022 complaining of a painful rash of 3 days duration.  She states the pain was severe and she was unable to wait to be seen.  Since that time she has been suffering with this painful rash.  At first it was blisters, that has drained, now she has crusting across the area.  It goes from the middle of her back, down the lower portion of her shoulder blade, under her breasts to the xiphoid.  The rash is painful.  The rash is itchy.  She has had no treatment to date.  Is been present for more than 2 weeks  Past Medical History:  Diagnosis Date   Anxiety     Patient Active Problem List   Diagnosis Date Noted   Elevated blood pressure reading 05/29/2021   Osteoarthritis of hands, bilateral 05/29/2021   BRONCHITIS, ACUTE 01/19/2011   ANXIETY 03/27/2010   Major depression, recurrent, chronic ([place]) 03/27/2010   UPPER RESPIRATORY INFECTION, ACUTE, WITH BRONCHITIS 03/27/2010    Past Surgical History:  Procedure Laterality Date   APPENDECTOMY     TONSILLECTOMY     TUBAL LIGATION      OB History   No obstetric history on file.      Home Medications    Prior to Admission medications   Medication Sig Start Date End Date Taking? Authorizing Provider  buPROPion (WELLBUTRIN XL) 300 MG 24 hr tablet Take 300 mg by mouth daily. 03/22/22  Yes [provider]  gabapentin (NEURONTIN) 300 MG capsule Take 1 capsule (300 mg total) by mouth 3 (three) times daily. 04/19/22  Yes Raylene Everts, MD  hydrOXYzine (ATARAX) 25 MG tablet Take 1-2 tablets (25-50 mg total) by mouth every 6 (six) hours as needed. 04/19/22  Yes Raylene Everts, MD  ALPRAZolam Duanne Moron) 1 MG tablet Take 1 mg by mouth 3 (three) times daily as needed. 05/15/21    [provider]  FLUoxetine (PROZAC) 20 MG capsule Take 20 mg by mouth 2 (two) times daily. 04/04/21   [provider]    Family History Family History  Problem Relation Age of Onset   Healthy Mother    Healthy Father     Social History Social History   Tobacco Use   Smoking status: Every Day    Packs/day: 0.50    Years: 20.00    Total pack years: 10.00    Types: Cigarettes   Smokeless tobacco: Never  Vaping Use   Vaping Use: Never used  Substance Use Topics   Alcohol use: No   Drug use: No     Allergies   Patient has no known allergies.   Review of Systems Review of Systems See HPI  Physical Exam Triage Vital Signs ED Triage Vitals  Enc Vitals Group     BP 04/19/22 1121 (!) 148/91     Pulse Rate 04/19/22 1121 87     Resp 04/19/22 1121 18     Temp 04/19/22 1121 98.7 F (37.1 C)     Temp Source 04/19/22 1121 Oral     SpO2 04/19/22 1121 99 %     Weight --      Height 04/19/22 1122 '4\' 11"'$  (  1.499 m)     Head Circumference --      Peak Flow --      Pain Score 04/19/22 1123 7     Pain Loc --      Pain Edu? --      Excl. in [place]? --    No data found.  Updated Vital Signs BP (!) 148/91 (BP Location: Left Arm)   Pulse 87   Temp 98.7 F (37.1 C) (Oral)   Resp 18   Ht '4\' 11"'$  (1.499 m)   LMP 11/20/2015 (Approximate)   SpO2 99%   BMI 23.27 kg/m       Physical Exam Constitutional:      General: She is not in acute distress.    Appearance: She is well-developed.     Comments: Rapid pressured speech.  Appears anxious.  Labile and tearful  HENT:     Head: Normocephalic and atraumatic.  Eyes:     Conjunctiva/sclera: Conjunctivae normal.     Pupils: Pupils are equal, round, and reactive to light.  Cardiovascular:     Rate and Rhythm: Normal rate.  Pulmonary:     Effort: Pulmonary effort is normal. No respiratory distress.  Abdominal:     General: There is no distension.     Palpations: Abdomen is soft.  Musculoskeletal:         General: Normal range of motion.     Cervical back: Normal range of motion.       Back:  Skin:    General: Skin is warm and dry.  Neurological:     Mental Status: She is alert.      UC Treatments / Results  Labs (all labs ordered are listed, but only abnormal results are displayed) Labs Reviewed - No data to display  EKG   Radiology No results found.  Procedures Procedures (including critical care time)  Medications Ordered in UC Medications - No data to display  Initial Impression / Assessment and Plan / UC Course  I have reviewed the triage vital signs and the nursing notes.  Pertinent labs & imaging results that were available during my care of the patient were reviewed by me and considered in my medical decision making (see chart for details).     The patient has a healing shingles rash.  Unfortunately is very uncomfortable and painful for her.  Since she did not get medical treatment, antivirals or steroids, she has some herpetic neuropathy.  Hopefully to be brief.  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  Take it for itching.  May cause drowsiness.  It is good to take at bedtime  Gabapentin is a medication to reduce nerve pain.  Take at bedtime.  May take up to 3 times a day.  May cause drowsiness.  The drowsiness does wear off after you have taken it a few days  Call Dr. Zigmund Daniel office Monday morning to set up an appointment within the next 2 weeks   ED Prescriptions     Medication Sig Dispense Auth. Provider   gabapentin (NEURONTIN) 300 MG capsule Take 1 capsule (300 mg total) by mouth 3 (three) times daily. 30 capsule Raylene Everts, MD   hydrOXYzine (ATARAX) 25 MG tablet Take 1-2 tablets (25-50 mg total) by mouth every 6 (six)  hours as needed. 20  tablet Raylene Everts, MD      PDMP not reviewed this encounter.   Raylene Everts, MD 04/19/22 [phone]

## 2022-06-26 ENCOUNTER — Telehealth: Payer: Self-pay | Admitting: Family Medicine

## 2022-06-26 NOTE — Telephone Encounter (Signed)
Called patient to schedule Medicare Annual Wellness Visit (AWV). Left message for patient to call back and schedule Medicare Annual Wellness Visit (AWV).  Last date of AWV: Never  Please schedule an appointment at any time with Nurse Health Advisor.  If any questions, please contact me at [phone].  Thank you ,  Morgan Jessup Patient Access Advocate II Direct Dial: [phone]   

## 2022-08-08 ENCOUNTER — Ambulatory Visit (INDEPENDENT_AMBULATORY_CARE_PROVIDER_SITE_OTHER): Payer: Medicare (Managed Care) | Admitting: Family Medicine

## 2022-08-08 ENCOUNTER — Encounter: Payer: Self-pay | Admitting: Family Medicine

## 2022-08-08 VITALS — BP 169/90 | HR 90 | Ht 59.0 in | Wt 114.0 lb

## 2022-08-08 DIAGNOSIS — F411 Generalized anxiety disorder: Secondary | ICD-10-CM

## 2022-08-08 DIAGNOSIS — F339 Major depressive disorder, recurrent, unspecified: Secondary | ICD-10-CM

## 2022-08-08 DIAGNOSIS — F39 Unspecified mood [affective] disorder: Secondary | ICD-10-CM | POA: Diagnosis not present

## 2022-08-08 DIAGNOSIS — H00012 Hordeolum externum right lower eyelid: Secondary | ICD-10-CM

## 2022-08-08 MED ORDER — ERYTHROMYCIN 5 MG/GM OP OINT: 1.0000 | TOPICAL_OINTMENT | Freq: Three times a day (TID) | OPHTHALMIC | 0 refills | Status: AC

## 2022-08-08 MED ORDER — ALPRAZOLAM 1 MG PO TABS: 1.0000 mg | ORAL_TABLET | Freq: Three times a day (TID) | ORAL | 0 refills | Status: DC | PRN

## 2022-08-08 MED ORDER — BUPROPION HCL ER (XL) 300 MG PO TB24: 300.0000 mg | ORAL_TABLET | Freq: Every day | ORAL | 0 refills | Status: DC

## 2022-08-08 MED ORDER — ERYTHROMYCIN 5 MG/GM OP OINT: 1.0000 | TOPICAL_OINTMENT | Freq: Three times a day (TID) | OPHTHALMIC | 0 refills | Status: DC

## 2022-08-08 MED ORDER — FLUOXETINE HCL 20 MG PO CAPS: 20.0000 mg | ORAL_CAPSULE | Freq: Two times a day (BID) | ORAL | 0 refills | Status: DC

## 2022-08-08 NOTE — Patient Instructions (Addendum)
Restart medications.  Use ointment for eye  Phone:  [phone]  Address:  [address]  [place], [place] [address]  Hours:  Open 24/7, No appointment required.

## 2022-08-10 DIAGNOSIS — H00019 Hordeolum externum unspecified eye, unspecified eyelid: Secondary | ICD-10-CM | POA: Insufficient documentation

## 2022-08-10 NOTE — Assessment & Plan Note (Signed)
She has an increased anxiety and depression over the past several weeks.  Has been off medications which she had to stop cold [place].  Will restart her medications with referrals to psychiatry as well as psychology for talk therapy.  As it may take some time to get her in with psychiatry I would like to see her back in about 1 month.

## 2022-08-10 NOTE — Progress Notes (Signed)
Laurie Berry - 57 y.o. female MRN [id number]  Date of birth: [birth year]  Subjective Chief Complaint  Patient presents with   Anxiety    HPI Laurie Berry is a 57 y.o. female here today for follow-up.  It has been nearly a year since her last visit with me.  She reports that he is having 1 very well recently.  She states that her psychiatrist recently moved out of state and she was assigned a new psychiatrist however she was dismissed from his practice.  She reports that this is due to "writing a bad review online".  She has been without her medications for several weeks.  She reports increased anxiety and depressive symptoms.  Several stressors including partial loss of her husband and continued loneliness related to this, losing custody of her grandchild and possibly moving out of the area.  She would like to get established with a new psychiatrist.  She does report having a stye of her lower right eyelid.  This is painful with some swelling.  No changes to vision.  Present for a little over a week.  She has tried warm compresses but not using very often.     08/08/2022    1:21 PM 05/29/2021    5:03 PM  Depression screen PHQ 2/9  Decreased Interest 2 3  Down, Depressed, Hopeless 2 3  PHQ - 2 Score 4 6  Altered sleeping 2 1  Tired, decreased energy 2 2  Change in appetite 2 2  Feeling bad or failure about yourself  2 2  Trouble concentrating 2 0  Moving slowly or fidgety/restless 2 0  Suicidal thoughts 1 0  PHQ-9 Score 17 13  Difficult doing work/chores Extremely dIfficult Extremely dIfficult      08/08/2022    1:21 PM  GAD 7 : Generalized Anxiety Score  Nervous, Anxious, on Edge 3  Control/stop worrying 2  Worry too much - different things 2  Trouble relaxing 2  Restless 2  Easily annoyed or irritable 2  Afraid - awful might happen 2  Total GAD 7 Score 15  Anxiety Difficulty Extremely difficult   ROS:  A comprehensive ROS was completed and negative except as noted per  HPI   Past Medical History:  Diagnosis Date   Anxiety     Past Surgical History:  Procedure Laterality Date   APPENDECTOMY     TONSILLECTOMY     TUBAL LIGATION      Social History   Socioeconomic History   Marital status: Married    Spouse name: Not on file   Number of children: Not on file   Years of education: Not on file   Highest education level: Not on file  Occupational History   Not on file  Tobacco Use   Smoking status: Every Day    Packs/day: 0.50    Years: 20.00    Additional pack years: 0.00    Total pack years: 10.00    Types: Cigarettes   Smokeless tobacco: Never  Vaping Use   Vaping Use: Never used  Substance and Sexual Activity   Alcohol use: No   Drug use: No   Sexual activity: Not on file  Other Topics Concern   Not on file  Social History Narrative   Not on file   Social Determinants of Health   Financial Resource Strain: Not on file  Food Insecurity: Not on file  Transportation Needs: Not on file  Physical Activity: Not on file  Stress: Not on  file  Social Connections: Not on file    Family History  Problem Relation Age of Onset   Healthy Mother    Healthy Father     Health Maintenance  Topic Date Due   Zoster Vaccines- Shingrix (1 of 2) 08/27/2022 (Originally 09/14/2014)   COVID-19 Vaccine (1) 01/24/2023 (Originally 03/15/1966)   PAP SMEAR-Modifier  02/07/2023 (Originally 09/13/1985)   Medicare Annual Wellness (AWV)  02/07/2023 (Originally [birth year])   MAMMOGRAM  08/08/2023 (Originally 09/14/2014)   COLONOSCOPY (Pts 45-24yrs Insurance coverage will need to be confirmed)  08/08/2023 (Originally 09/13/2009)   Hepatitis C Screening  08/08/2023 (Originally 09/14/1982)   HIV Screening  08/08/2023 (Originally 09/14/1979)   INFLUENZA VACCINE  11/13/2022   DTaP/Tdap/Td (2 - Td or Tdap) 02/08/2023   HPV VACCINES  Aged Out      ----------------------------------------------------------------------------------------------------------------------------------------------------------------------------------------------------------------- Physical Exam BP (!) 169/90 (BP Location: Left Arm, Patient Position: Sitting, Cuff Size: Normal)   Pulse 90   Ht 4\' 11"  (1.499 m)   Wt 114 lb (51.7 kg)   LMP 11/20/2015 (Approximate)   SpO2 99%   BMI 23.03 kg/m   Physical Exam Constitutional:      Appearance: Normal appearance.  HENT:     Head: Normocephalic and atraumatic.  Neurological:     Mental Status: She is alert.  Psychiatric:        Attention and Perception: Attention normal.        Mood and Affect: Mood is anxious and depressed.        Behavior: Behavior is hyperactive.        Thought Content: Thought content does not include homicidal or suicidal ideation. Thought content does not include homicidal or suicidal plan.     Comments: Overproductive speech with tangential thinking.     ------------------------------------------------------------------------------------------------------------------------------------------------------------------------------------------------------------------- Assessment and Plan  Major depression, recurrent, chronic (HCC) She has an increased anxiety and depression over the past several weeks.  Has been off medications which she had to stop cold [place].  Will restart her medications with referrals to psychiatry as well as psychology for talk therapy.  As it may take some time to get her in with psychiatry I would like to see her back in about 1 month.  Stye Recommend using warm compresses more often.  She reports trying this but not very often.  And erythromycin ointment as well if not improving.   Meds ordered this encounter  Medications   DISCONTD: ALPRAZolam (XANAX) 1 MG tablet    Sig: Take 1 tablet (1 mg total) by mouth 3 (three) times daily as needed.    Dispense:   30 tablet    Refill:  0   DISCONTD: buPROPion (WELLBUTRIN XL) 300 MG 24 hr tablet    Sig: Take 1 tablet (300 mg total) by mouth daily.    Dispense:  90 tablet    Refill:  0   DISCONTD: FLUoxetine (PROZAC) 20 MG capsule    Sig: Take 1 capsule (20 mg total) by mouth 2 (two) times daily.    Dispense:  90 capsule    Refill:  0   DISCONTD: erythromycin ophthalmic ointment    Sig: Place 1 Application into the right eye 3 (three) times daily.    Dispense:  3.5 g    Refill:  0   FLUoxetine (PROZAC) 20 MG capsule    Sig: Take 1 capsule (20 mg total) by mouth 2 (two) times daily.    Dispense:  90 capsule    Refill:  0   erythromycin ophthalmic  ointment    Sig: Place 1 Application into the right eye 3 (three) times daily.    Dispense:  3.5 g    Refill:  0   buPROPion (WELLBUTRIN XL) 300 MG 24 hr tablet    Sig: Take 1 tablet (300 mg total) by mouth daily.    Dispense:  90 tablet    Refill:  0   ALPRAZolam (XANAX) 1 MG tablet    Sig: Take 1 tablet (1 mg total) by mouth 3 (three) times daily as needed.    Dispense:  30 tablet    Refill:  0    No follow-ups on file.    This visit occurred during the SARS-CoV-2 public health emergency.  Safety protocols were in place, including screening questions prior to the visit, additional usage of staff PPE, and extensive cleaning of exam room while observing appropriate contact time as indicated for disinfecting solutions.

## 2022-08-10 NOTE — Assessment & Plan Note (Signed)
Recommend using warm compresses more often.  She reports trying this but not very often.  And erythromycin ointment as well if not improving.

## 2022-09-19 ENCOUNTER — Ambulatory Visit (INDEPENDENT_AMBULATORY_CARE_PROVIDER_SITE_OTHER): Payer: Medicare (Managed Care) | Admitting: Family Medicine

## 2022-09-19 ENCOUNTER — Encounter: Payer: Self-pay | Admitting: Family Medicine

## 2022-09-19 VITALS — BP 184/80 | HR 88 | Ht 59.0 in | Wt 115.0 lb

## 2022-09-19 DIAGNOSIS — F339 Major depressive disorder, recurrent, unspecified: Secondary | ICD-10-CM

## 2022-09-19 DIAGNOSIS — F39 Unspecified mood [affective] disorder: Secondary | ICD-10-CM | POA: Diagnosis not present

## 2022-09-19 NOTE — Patient Instructions (Addendum)
We have scheduled you an appointment with psychiatry (Dr. Gilmore Laroche) on Tuesday 10/07/22 at 9am (Arrive by 845am).   Therapy 09/23/22 at 2pm (check in at 145pm) You can call [phone] if you need to update or change these apppointments  If you need immediate care please visit the [facility].

## 2022-09-19 NOTE — Progress Notes (Unsigned)
Laurie Berry - 57 y.o. female MRN [id number]  Date of birth: 1965-05-26  Subjective Chief Complaint  Patient presents with   Anxiety    HPI Laurie Berry is a 57 y.o. female here today for follow up.   Currently taking fluoxetine and bupropion with alprazolam as needed.  Referral placed to psychiatry.  Referral reviewed and it appears that they contacted her x3 but call never returned.  She reports calling them several times with no answer.  Per referral notes she declined appt with therapist.  She is still very interested in seeing psychiatry.  Reports that appetite is normal.  She is having difficulty sleeping, this is  chronic.  Using alprazolam currently.  She has declined other medications to help with sleep.   ROS:  A comprehensive ROS was completed and negative except as noted per HPI  No Known Allergies  Past Medical History:  Diagnosis Date   Anxiety     Past Surgical History:  Procedure Laterality Date   APPENDECTOMY     TONSILLECTOMY     TUBAL LIGATION      Social History   Socioeconomic History   Marital status: Married    Spouse name: Not on file   Number of children: Not on file   Years of education: Not on file   Highest education level: Not on file  Occupational History   Not on file  Tobacco Use   Smoking status: Every Day    Packs/day: 0.50    Years: 20.00    Additional pack years: 0.00    Total pack years: 10.00    Types: Cigarettes   Smokeless tobacco: Never  Vaping Use   Vaping Use: Never used  Substance and Sexual Activity   Alcohol use: No   Drug use: No   Sexual activity: Not on file  Other Topics Concern   Not on file  Social History Narrative   Not on file   Social Determinants of Health   Financial Resource Strain: Not on file  Food Insecurity: Not on file  Transportation Needs: Not on file  Physical Activity: Not on file  Stress: Not on file  Social Connections: Not on file    Family History  Problem Relation Age of  Onset   Healthy Mother    Healthy Father     Health Maintenance  Topic Date Due   Zoster Vaccines- Shingrix (1 of 2) 12/20/2022 (Originally 09/14/2014)   COVID-19 Vaccine (1) 01/24/2023 (Originally 03/15/1965)   PAP SMEAR-Modifier  02/07/2023 (Originally 09/13/1985)   Medicare Annual Wellness (AWV)  02/07/2023 (Originally 08-22-1965)   MAMMOGRAM  08/08/2023 (Originally 09/14/2014)   Colonoscopy  08/08/2023 (Originally 09/13/2009)   Hepatitis C Screening  08/08/2023 (Originally 09/14/1982)   HIV Screening  08/08/2023 (Originally 09/14/1979)   INFLUENZA VACCINE  11/13/2022   DTaP/Tdap/Td (2 - Td or Tdap) 02/08/2023   HPV VACCINES  Aged Out     ----------------------------------------------------------------------------------------------------------------------------------------------------------------------------------------------------------------- Physical Exam BP (!) 184/80 (BP Location: Left Arm, Patient Position: Sitting, Cuff Size: Normal)   Pulse 88   Ht 4\' 11"  (1.499 m)   Wt 115 lb (52.2 kg)   LMP 11/20/2015 (Approximate)   SpO2 99%   BMI 23.23 kg/m   Physical Exam Constitutional:      Appearance: Normal appearance.  HENT:     Head: Normocephalic and atraumatic.  Eyes:     General: No scleral icterus. Neurological:     Mental Status: She is alert.  Psychiatric:  Mood and Affect: Mood normal.        Behavior: Behavior normal.     ------------------------------------------------------------------------------------------------------------------------------------------------------------------------------------------------------------------- Assessment and Plan  No problem-specific Assessment & Plan notes found for this encounter.   No orders of the defined types were placed in this encounter.   No follow-ups on file.    This visit occurred during the SARS-CoV-2 public health emergency.  Safety protocols were in place, including screening questions prior to the  visit, additional usage of staff PPE, and extensive cleaning of exam room while observing appropriate contact time as indicated for disinfecting solutions.

## 2022-09-21 NOTE — Assessment & Plan Note (Addendum)
I encouraged her to take medications regularly.  Suspect she has bipolar mood disorder and speech is rapid and pressured on exam today.  Reviewed referral to psychiatry and appointment made before she left the office today.  She was provided with phone number to update or change appointment if needed.  Given information on behavioral health urgent care available in [place].

## 2022-09-22 ENCOUNTER — Telehealth (HOSPITAL_COMMUNITY): Payer: Self-pay

## 2022-09-22 NOTE — Telephone Encounter (Signed)
Reminder call about new pt appointment for 09/23/22 at 2pm. No answer, left voicemail requesting a call back (direct office) to confirm.

## 2022-09-23 ENCOUNTER — Ambulatory Visit (INDEPENDENT_AMBULATORY_CARE_PROVIDER_SITE_OTHER): Payer: Medicare (Managed Care) | Admitting: Licensed Clinical Social Worker

## 2022-09-23 DIAGNOSIS — F411 Generalized anxiety disorder: Secondary | ICD-10-CM

## 2022-09-23 DIAGNOSIS — F331 Major depressive disorder, recurrent, moderate: Secondary | ICD-10-CM | POA: Diagnosis not present

## 2022-09-23 DIAGNOSIS — F39 Unspecified mood [affective] disorder: Secondary | ICD-10-CM
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
## 2022-10-07 ENCOUNTER — Ambulatory Visit (INDEPENDENT_AMBULATORY_CARE_PROVIDER_SITE_OTHER): Payer: Medicare (Managed Care) | Admitting: Psychiatry

## 2022-10-07 ENCOUNTER — Encounter (HOSPITAL_COMMUNITY): Payer: Self-pay | Admitting: Psychiatry

## 2022-10-07 VITALS — BP 180/90 | HR 94 | Ht 59.0 in | Wt 115.0 lb

## 2022-10-07 DIAGNOSIS — F39 Unspecified mood [affective] disorder: Secondary | ICD-10-CM | POA: Diagnosis not present

## 2022-10-07 DIAGNOSIS — F331 Major depressive disorder, recurrent, moderate: Secondary | ICD-10-CM | POA: Diagnosis not present

## 2022-10-07 DIAGNOSIS — F411 Generalized anxiety disorder: Secondary | ICD-10-CM

## 2022-10-07 MED ORDER — FLUOXETINE HCL 20 MG PO CAPS: 20.0000 mg | ORAL_CAPSULE | Freq: Two times a day (BID) | ORAL | 0 refills | Status: DC

## 2022-10-07 MED ORDER — LAMOTRIGINE 25 MG PO TABS
25.0000 mg | ORAL_TABLET | Freq: Every day | ORAL | 0 refills | Status: DC
Start: 2022-10-07 — End: 2022-11-10
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
## 2022-11-06 ENCOUNTER — Ambulatory Visit (HOSPITAL_COMMUNITY): Payer: Medicare (Managed Care) | Admitting: Psychiatry

## 2022-11-10 ENCOUNTER — Telehealth (HOSPITAL_COMMUNITY): Payer: Self-pay

## 2022-11-10 MED ORDER — LAMOTRIGINE 25 MG PO TABS: 25.0000 mg | ORAL_TABLET | Freq: Every day | ORAL | 0 refills | Status: DC

## 2022-11-10 NOTE — Telephone Encounter (Signed)
Medication refill - Fax from pt's [facility] in [place] for a new Lamotrigine 25 mg, 2 a day after initial taper order was e-scribed and filled 10/07/22. Pt does not return until 11/25/22.

## 2022-11-18 ENCOUNTER — Ambulatory Visit (INDEPENDENT_AMBULATORY_CARE_PROVIDER_SITE_OTHER): Payer: Medicare (Managed Care) | Admitting: Licensed Clinical Social Worker

## 2022-11-18 DIAGNOSIS — F411 Generalized anxiety disorder: Secondary | ICD-10-CM

## 2022-11-18 DIAGNOSIS — F39 Unspecified mood [affective] disorder: Secondary | ICD-10-CM

## 2022-11-18 DIAGNOSIS — F331 Major depressive disorder, recurrent, moderate: Secondary | ICD-10-CM
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
## 2022-11-25 ENCOUNTER — Encounter (HOSPITAL_COMMUNITY): Payer: Self-pay | Admitting: Psychiatry

## 2022-11-25 ENCOUNTER — Ambulatory Visit (INDEPENDENT_AMBULATORY_CARE_PROVIDER_SITE_OTHER): Payer: Medicare (Managed Care) | Admitting: Psychiatry

## 2022-11-25 VITALS — BP 152/90 | HR 88 | Ht 59.0 in | Wt 115.0 lb

## 2022-11-25 DIAGNOSIS — F39 Unspecified mood [affective] disorder: Secondary | ICD-10-CM

## 2022-11-25 DIAGNOSIS — F331 Major depressive disorder, recurrent, moderate: Secondary | ICD-10-CM

## 2022-11-25 DIAGNOSIS — F3160 Bipolar disorder, current episode mixed, unspecified: Secondary | ICD-10-CM | POA: Diagnosis not present

## 2022-11-25 DIAGNOSIS — F411 Generalized anxiety disorder: Secondary | ICD-10-CM | POA: Diagnosis not present

## 2022-11-25 MED ORDER — FLUOXETINE HCL 20 MG PO CAPS: 20.0000 mg | ORAL_CAPSULE | Freq: Two times a day (BID) | ORAL | 0 refills | Status: AC

## 2022-11-25 MED ORDER — LAMOTRIGINE 25 MG PO TABS: 25.0000 mg | ORAL_TABLET | Freq: Every day | ORAL | 1 refills | Status: AC

## 2022-11-25 NOTE — Progress Notes (Signed)
[facility] Follow up visit   Patient Identification: Laurie Berry MRN:  [id number] Date of Evaluation:  11/25/2022 Referral Source: dr. Selena Batten and clinic therapist Chief Complaint:   Chief Complaint  Patient presents with   Follow-up  Mood and anxiety follow up  Visit Diagnosis:    ICD-10-CM   1. Bipolar I disorder, most recent episode mixed (HCC)  F31.60     2. Episodic mood disorder (HCC)  F39     3. Generalized anxiety disorder  F41.1     4. Major depressive disorder, recurrent episode, moderate (HCC)  F33.1       History of Present Illness:  57 years old widow of 4 years. Has one grown daughter 46 years of age. Patient states on disability. Referred initially to establish care for mood and and depression   She has had difficult time due to husband committed suicide 4 years ago and her daughter into drugs, had acciddent lost custody of her daughter. Patient cannot see her grand kid  Last visit was mostly crying and rambling  Last visit we added lamictal it has helped, less moody and feels less hopeless. Still rambles and now in therapy but feel emotionally some more in control  No rash   Now off xanax for more then  2- 3months , has been on xanax for 13 years prior to that  Says not taking wellbutrin for couple of weeks as it was causing nausea and didn't help with depression.   Aggravating factors: husband committed suicide 4 years ago 2020, daughter had accident head on 2014, finances  Modifying factor: says not that many   Duration adult life or more then 13 years  Says has been on xanax for more then 13 years Severity better with [facility] admission denies for mental health  Past Psychiatric History: depression, anxiety, grief  Previous Psychotropic Medications: Yes   Substance Abuse History in the last 12 months:  No.  Consequences of Substance Abuse: Denies using drugs or alcohol. Understands the risk to depression  Past Medical History:  Past Medical  History:  Diagnosis Date   Anxiety     Past Surgical History:  Procedure Laterality Date   APPENDECTOMY     TONSILLECTOMY     TUBAL LIGATION      Family Psychiatric History: daughter has bipolar,   Family History:  Family History  Problem Relation Age of Onset   Healthy Mother    Healthy Father     Social History:   Social History   Socioeconomic History   Marital status: Married    Spouse name: Not on file   Number of children: Not on file   Years of education: Not on file   Highest education level: Not on file  Occupational History   Not on file  Tobacco Use   Smoking status: Every Day    Current packs/day: 0.50    Average packs/day: 0.5 packs/day for 20.0 years (10.0 ttl pk-yrs)    Types: Cigarettes   Smokeless tobacco: Never  Vaping Use   Vaping status: Never Used  Substance and Sexual Activity   Alcohol use: No   Drug use: No   Sexual activity: Not on file  Other Topics Concern   Not on file  Social History Narrative   Not on file   Social Determinants of Health   Financial Resource Strain: Not on file  Food Insecurity: Not on file  Transportation Needs: Not on file  Physical Activity: Not on file  Stress: Not on file  Social Connections: Unknown (04/04/2022)   Received from [facility]   Social Network    Social Network: Not on file     Allergies:  No Known Allergies  Metabolic Disorder Labs: No results found for: "HGBA1C", "MPG" No results found for: "PROLACTIN" Lab Results  Component Value Date   CHOL 250 (H) 05/29/2021   TRIG 302 (H) 05/29/2021   HDL 41 (L) 05/29/2021   CHOLHDL 6.1 (H) 05/29/2021   LDLCALC 163 (H) 05/29/2021   Lab Results  Component Value Date   TSH 0.34 (L) 05/29/2021    Therapeutic Level Labs: No results found for: "LITHIUM" No results found for: "CBMZ" No results found for: "VALPROATE"  Current Medications: Current Outpatient Medications  Medication Sig Dispense Refill   erythromycin ophthalmic  ointment Place 1 Application into the right eye 3 (three) times daily. 3.5 g 0   FLUoxetine (PROZAC) 20 MG capsule Take 1 capsule (20 mg total) by mouth 2 (two) times daily. 90 capsule 0   lamoTRIgine (LAMICTAL) 25 MG tablet Take 1 tablet (25 mg total) by mouth daily. Take one tablet daily for a week and then start taking 2 tablets. 60 tablet 0   ALPRAZolam (XANAX) 1 MG tablet Take 1 tablet (1 mg total) by mouth 3 (three) times daily as needed. (Patient not taking: Reported on 11/25/2022) 30 tablet 0   No current facility-administered medications for this visit.     Psychiatric Specialty Exam: Review of Systems  Cardiovascular:  Negative for chest pain.  Skin:  Negative for rash.  Neurological:  Negative for tremors.    Blood pressure (!) 152/90, pulse 88, height 4\' 11"  (1.499 m), weight 115 lb (52.2 kg), last menstrual period 11/20/2015.Body mass index is 23.23 kg/m.  General Appearance: Casual  Eye Contact:  Fair  Speech:  Pressured  Volume:  Normal  Mood:  somewhat subdued but better  Affect:  Labile  Thought Process:  Goal Directed  Orientation:  Full (Time, Place, and Person)  Thought Content:  Rumination  Suicidal Thoughts:  No  Homicidal Thoughts:  No  Memory:  Immediate;   Fair  Judgement:  Fair  Insight:  Lacking  Psychomotor Activity:  Increased  Concentration:  Concentration: Fair  Recall:  Fair  Fund of Knowledge:Fair  Language: Fair  Akathisia:  No  Handed:    AIMS (if indicated):  no involuntary movements  Assets:  Desire for Improvement  ADL's:  Intact  Cognition: WNL  Sleep:  Fair   Screenings: GAD-7    Flowsheet Row Office Visit from 08/08/2022 in [facility] at [facility]  Total GAD-7 Score 15      PHQ2-9    Flowsheet Row Counselor from 09/23/2022 in [facility] at [facility] Most recent reading at 11/18/2022  2:45 PM Office Visit from 10/07/2022 in [facility]  Outpatient Behavioral Health at [facility] Most recent reading at 10/07/2022  9:12 AM Office Visit from 08/08/2022 in [facility] Primary Care & Sports Medicine at [facility] Most recent reading at 08/08/2022  1:21 PM Office Visit from 05/29/2021 in [facility] & [facility] Medicine at [facility] Most recent reading at 05/29/2021  5:03 PM  PHQ-2 Total Score 6 4 4 6   PHQ-9 Total Score 12 11 17 13       Flowsheet Row Counselor from 09/23/2022 in [facility] Outpatient Behavioral Health at [facility] Most recent reading at 11/18/2022  2:45 PM  Office Visit from 10/07/2022 in [facility] Outpatient Behavioral Health at [facility] Most recent reading at 10/07/2022  9:25 AM ED from 04/19/2022 in [facility] [place] Most recent reading at 04/19/2022 11:22 AM  C-SSRS RISK CATEGORY Error: Q3, 4, or 5 should not be populated when Q2 is No Error: Q3, 4, or 5 should not be populated when Q2 is No No Risk       Assessment and Plan: as follows Prior documentation reviewed  MDD recurrent severe r/o Bipolar mixed episode or depressed:  less depressed or edgy, will continue lamcital 50mg  . She does not want to increase for now  Also in therapy    GAD:  some better, also not on xanax for months. Discussed med prozac to continue 20mg  bid  Grief: over her husband death. But handling it better continue prozac  Fu  2-28m. Continue therapy   Direct care time spent    20   including chart review, face to face, documentation and collaboration if any   Collaboration of Care: Primary Care Provider AEB notes and chart reviewed  Patient/Guardian was advised Release of Information must be obtained prior to any record release in order to collaborate their care with an outside provider. Patient/Guardian was advised if they have not already done so to contact the registration department to sign all necessary forms in order for [place] to release  information regarding their care.   Consent: Patient/Guardian gives verbal consent for treatment and assignment of benefits for services provided during this visit. Patient/Guardian expressed understanding and agreed to proceed.   Thresa Ross, MD 8/13/20244:13 PM

## 2022-12-02 ENCOUNTER — Encounter (HOSPITAL_COMMUNITY): Payer: Self-pay

## 2022-12-02 ENCOUNTER — Ambulatory Visit (INDEPENDENT_AMBULATORY_CARE_PROVIDER_SITE_OTHER): Payer: Medicare (Managed Care) | Admitting: Licensed Clinical Social Worker

## 2022-12-02 DIAGNOSIS — F39 Unspecified mood [affective] disorder: Secondary | ICD-10-CM

## 2022-12-02 DIAGNOSIS — F3160 Bipolar disorder, current episode mixed, unspecified: Secondary | ICD-10-CM

## 2022-12-02 DIAGNOSIS — F411 Generalized anxiety disorder: Secondary | ICD-10-CM

## 2022-12-02 DIAGNOSIS — F331 Major depressive disorder, recurrent, moderate: Secondary | ICD-10-CM
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
## 2022-12-16 ENCOUNTER — Ambulatory Visit (INDEPENDENT_AMBULATORY_CARE_PROVIDER_SITE_OTHER): Payer: Medicare (Managed Care) | Admitting: Licensed Clinical Social Worker

## 2022-12-16 DIAGNOSIS — F3160 Bipolar disorder, current episode mixed, unspecified: Secondary | ICD-10-CM | POA: Diagnosis not present

## 2022-12-16 DIAGNOSIS — F411 Generalized anxiety disorder: Secondary | ICD-10-CM

## 2022-12-16 DIAGNOSIS — F39 Unspecified mood [affective] disorder: Secondary | ICD-10-CM | POA: Diagnosis not present

## 2022-12-16 DIAGNOSIS — F331 Major depressive disorder, recurrent, moderate: Secondary | ICD-10-CM
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
## 2022-12-30 ENCOUNTER — Ambulatory Visit (INDEPENDENT_AMBULATORY_CARE_PROVIDER_SITE_OTHER): Payer: Self-pay | Admitting: Licensed Clinical Social Worker

## 2022-12-30 DIAGNOSIS — F331 Major depressive disorder, recurrent, moderate: Secondary | ICD-10-CM

## 2022-12-30 DIAGNOSIS — F411 Generalized anxiety disorder: Secondary | ICD-10-CM

## 2022-12-30 DIAGNOSIS — F39 Unspecified mood [affective] disorder: Secondary | ICD-10-CM

## 2022-12-30 DIAGNOSIS — F3160 Bipolar disorder, current episode mixed, unspecified: Secondary | ICD-10-CM

## 2022-12-30 NOTE — Progress Notes (Signed)
Patient did not show for appointment.

## 2023-01-27 ENCOUNTER — Ambulatory Visit (HOSPITAL_COMMUNITY): Payer: Medicare (Managed Care) | Admitting: Psychiatry
